# Patient Record
Sex: Male | Born: 1955 | Race: Black or African American | Hispanic: No | Marital: Single | Smoking: Current every day smoker
Health system: Southern US, Community
[De-identification: ages and names within clinical notes are randomized; demographics above are authoritative.]

## PROBLEM LIST (undated history)

## (undated) DIAGNOSIS — K7030 Alcoholic cirrhosis of liver without ascites: Secondary | ICD-10-CM

## (undated) DIAGNOSIS — I10 Essential (primary) hypertension: Secondary | ICD-10-CM

## (undated) DIAGNOSIS — F1020 Alcohol dependence, uncomplicated: Secondary | ICD-10-CM

## (undated) DIAGNOSIS — J449 Chronic obstructive pulmonary disease, unspecified: Secondary | ICD-10-CM

## (undated) DIAGNOSIS — M1990 Unspecified osteoarthritis, unspecified site: Secondary | ICD-10-CM

## (undated) DIAGNOSIS — G629 Polyneuropathy, unspecified: Secondary | ICD-10-CM

## (undated) DIAGNOSIS — G9340 Encephalopathy, unspecified: Secondary | ICD-10-CM

## (undated) HISTORY — PX: JOINT REPLACEMENT: SHX530

---

## 2013-09-11 DIAGNOSIS — F1090 Alcohol use, unspecified, uncomplicated: Secondary | ICD-10-CM

## 2013-09-11 HISTORY — DX: Alcohol use, unspecified, uncomplicated: F10.90

## 2013-09-19 DIAGNOSIS — Z96649 Presence of unspecified artificial hip joint: Secondary | ICD-10-CM | POA: Insufficient documentation

## 2013-09-19 HISTORY — DX: Presence of unspecified artificial hip joint: Z96.649

## 2013-09-20 DIAGNOSIS — M1612 Unilateral primary osteoarthritis, left hip: Secondary | ICD-10-CM | POA: Insufficient documentation

## 2013-09-20 HISTORY — DX: Unilateral primary osteoarthritis, left hip: M16.12

## 2016-01-06 DIAGNOSIS — G8929 Other chronic pain: Secondary | ICD-10-CM | POA: Insufficient documentation

## 2016-01-06 HISTORY — DX: Other chronic pain: G89.29

## 2016-05-19 DIAGNOSIS — R0683 Snoring: Secondary | ICD-10-CM | POA: Insufficient documentation

## 2016-05-19 DIAGNOSIS — J449 Chronic obstructive pulmonary disease, unspecified: Secondary | ICD-10-CM | POA: Insufficient documentation

## 2016-05-19 HISTORY — DX: Chronic obstructive pulmonary disease, unspecified: J44.9

## 2016-05-19 HISTORY — DX: Snoring: R06.83

## 2016-09-07 DIAGNOSIS — F1010 Alcohol abuse, uncomplicated: Secondary | ICD-10-CM | POA: Insufficient documentation

## 2016-09-07 DIAGNOSIS — E785 Hyperlipidemia, unspecified: Secondary | ICD-10-CM | POA: Insufficient documentation

## 2016-09-07 DIAGNOSIS — B182 Chronic viral hepatitis C: Secondary | ICD-10-CM | POA: Insufficient documentation

## 2016-09-07 DIAGNOSIS — E230 Hypopituitarism: Secondary | ICD-10-CM | POA: Insufficient documentation

## 2016-09-07 DIAGNOSIS — E291 Testicular hypofunction: Secondary | ICD-10-CM

## 2016-09-07 DIAGNOSIS — F1411 Cocaine abuse, in remission: Secondary | ICD-10-CM | POA: Insufficient documentation

## 2016-09-07 DIAGNOSIS — E871 Hypo-osmolality and hyponatremia: Secondary | ICD-10-CM

## 2016-09-07 DIAGNOSIS — D509 Iron deficiency anemia, unspecified: Secondary | ICD-10-CM | POA: Insufficient documentation

## 2016-09-07 HISTORY — DX: Testicular hypofunction: E29.1

## 2016-09-07 HISTORY — DX: Cocaine abuse, in remission: F14.11

## 2016-09-07 HISTORY — DX: Chronic viral hepatitis C: B18.2

## 2016-09-07 HISTORY — DX: Hypopituitarism: E23.0

## 2016-09-07 HISTORY — DX: Hyperlipidemia, unspecified: E78.5

## 2016-09-07 HISTORY — DX: Hypo-osmolality and hyponatremia: E87.1

## 2016-09-07 HISTORY — DX: Alcohol abuse, uncomplicated: F10.10

## 2016-09-07 HISTORY — DX: Iron deficiency anemia, unspecified: D50.9

## 2016-09-09 DIAGNOSIS — E876 Hypokalemia: Secondary | ICD-10-CM | POA: Insufficient documentation

## 2016-09-09 HISTORY — DX: Hypokalemia: E87.6

## 2017-03-28 DIAGNOSIS — E878 Other disorders of electrolyte and fluid balance, not elsewhere classified: Secondary | ICD-10-CM

## 2017-03-28 HISTORY — DX: Other disorders of electrolyte and fluid balance, not elsewhere classified: E87.8

## 2017-04-09 DIAGNOSIS — M818 Other osteoporosis without current pathological fracture: Secondary | ICD-10-CM

## 2017-04-09 HISTORY — DX: Other osteoporosis without current pathological fracture: M81.8

## 2018-01-25 DIAGNOSIS — R7989 Other specified abnormal findings of blood chemistry: Secondary | ICD-10-CM

## 2018-01-25 HISTORY — DX: Other specified abnormal findings of blood chemistry: R79.89

## 2019-03-21 DIAGNOSIS — F1410 Cocaine abuse, uncomplicated: Secondary | ICD-10-CM | POA: Insufficient documentation

## 2019-03-21 HISTORY — DX: Cocaine abuse, uncomplicated: F14.10

## 2019-05-31 DIAGNOSIS — F1290 Cannabis use, unspecified, uncomplicated: Secondary | ICD-10-CM | POA: Insufficient documentation

## 2019-05-31 DIAGNOSIS — E785 Hyperlipidemia, unspecified: Secondary | ICD-10-CM | POA: Insufficient documentation

## 2019-05-31 HISTORY — DX: Hyperlipidemia, unspecified: E78.5

## 2019-05-31 HISTORY — DX: Cannabis use, unspecified, uncomplicated: F12.90

## 2020-09-10 DIAGNOSIS — K7030 Alcoholic cirrhosis of liver without ascites: Secondary | ICD-10-CM | POA: Insufficient documentation

## 2020-09-10 DIAGNOSIS — R1310 Dysphagia, unspecified: Secondary | ICD-10-CM | POA: Insufficient documentation

## 2020-09-10 DIAGNOSIS — R410 Disorientation, unspecified: Secondary | ICD-10-CM | POA: Insufficient documentation

## 2020-09-10 DIAGNOSIS — E512 Wernicke's encephalopathy: Secondary | ICD-10-CM | POA: Insufficient documentation

## 2020-09-10 HISTORY — DX: Dysphagia, unspecified: R13.10

## 2020-09-10 HISTORY — DX: Wernicke's encephalopathy: E51.2

## 2020-09-10 HISTORY — DX: Alcoholic cirrhosis of liver without ascites: K70.30

## 2020-09-10 HISTORY — DX: Disorientation, unspecified: R41.0

## 2020-09-11 DIAGNOSIS — N390 Urinary tract infection, site not specified: Secondary | ICD-10-CM | POA: Insufficient documentation

## 2020-09-11 DIAGNOSIS — B9620 Unspecified Escherichia coli [E. coli] as the cause of diseases classified elsewhere: Secondary | ICD-10-CM

## 2020-09-11 HISTORY — DX: Urinary tract infection, site not specified: B96.20

## 2021-02-24 ENCOUNTER — Encounter: Payer: Self-pay | Admitting: Nurse Practitioner

## 2021-02-24 ENCOUNTER — Non-Acute Institutional Stay: Payer: Medicare Other | Admitting: Nurse Practitioner

## 2021-02-24 VITALS — BP 121/68 | HR 71 | Temp 97.1°F | Resp 18 | Wt 155.6 lb

## 2021-02-24 DIAGNOSIS — Z515 Encounter for palliative care: Secondary | ICD-10-CM

## 2021-02-24 DIAGNOSIS — R5381 Other malaise: Secondary | ICD-10-CM

## 2021-02-24 NOTE — Progress Notes (Signed)
[facility] Consult Note Telephone: [phone]  Fax: [phone]   Date of encounter: 02/24/21 PATIENT NAME: Waynoka Rm [place] [address]   [phone] (home)  DOB: 09/06/1955 MRN: [id number] PRIMARY CARE PROVIDER:   Dr Lucianne Lei [facility] RESPONSIBLE PARTY:  Brother Yiannis Tulloch:  [phone]  I met face to face with patient in facility. Palliative Care was asked to follow this patient by consultation request of  Dr Nona Dell to address advance care planning and complex medical decision making. This is the initial visit.  ASSESSMENT AND PLAN / RECOMMENDATIONS:  Symptom Management/Plan: ACP: full code with wishes full scope interventions; pending discussions with brother 2. Debility secondary to COPD, dysphagia, continue to encourage mobility, strengthening, self independence 3. Tobacco cessation declined 4. Goals of Care: Goals include to maximize quality of life and symptom management. Our advance care planning conversation included a discussion about:    The value and importance of advance care planning  Exploration of personal, cultural or spiritual beliefs that might influence medical decisions  Exploration of goals of care in the event of a sudden injury or illness  Identification and preparation of a healthcare agent  Review and updating or creation of an advance directive document. 5. Palliative care encounter; Palliative care encounter; Palliative medicine team will continue to support patient, patient's family, and medical team. Visit consisted of counseling and education dealing with the complex and emotionally intense issues of symptom management and palliative care in the setting of serious and potentially life-threatening illness  Follow up Palliative Care Visit: Palliative care will continue to follow for complex medical decision making, advance care  planning, and clarification of goals. Return 4 weeks or prn.  I spent 65 minutes providing this consultation. More than 50% of the time in this consultation was spent in counseling and care coordination. PPS: 50%  Chief Complaint: Initial palliative consult for complex medical decision making  HISTORY OF PRESENT ILLNESS:  Dal Blew is a 65 y.o. year old male  with multiple medical problems to include COPD, dysphasia, hepatic stenosis, hypertension, hyperlipidemia, substance abuse including alcohol, cocaine abuse, Protein calorie malnutrition. 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 Mr. Hannen was discharged to [facility], [place] facility where he currently resides. He does transfer to the wheelchair where he is mobile. Mr Monforte does go out to smoke on the smoking patio. Mr Stankowski does require some assistance with adls bathing, dressing. Mr. Bun does feed himself with fair appetite. Mr.Karpel is on a heart healthy diet regular texture regular liquid consistency with current weight 155.6 pounds, BMI 20. Mr. Wallman is a full code. Staff endorses no new changes or concerns. At present Mr Mckillop is lying in bed asleep. Mr Winker did awake to verbal cues. We talked about purpose of palliative care visit. Mr Crofford and agreement. We talked about how he was feeling. Mr Rutigliano endorses doing better he is ready to go home. We talked about the last time he was independent at home. Mr Atchley endorses that his mother passed away in 09/10/2022 and then he it began drinking with substance abuse and ended up in the hospital. We talked about past medical history. We talked about smoking cessation which he declines. About symptoms of pain and shortness of breath which he denies. We talked about his appetite including foods that he likes. We talked  about his current functional abilities. We talked about life review  period we talked about not married without children. We talked about prior hospitalization he was living in an apartment. We talked about currently residing at skilled facility. We talked about realistic expectations. We talked about coping strategies. We talked about role palliative care and plan of care. We talked about follow up visit in four weeks if needed or sooner should he decline. Medical goals reviewed. I have attempted to contact his brother Karman Veney for update on palliative care visit, further discussion of code status and goals of care. I updated staff no new changes at present time.  11/09/2020 weight 144 lbs 12/02/2020 weight 149 lbs 01/13/2021 weight 155.6 lbs BMI 20  02/04/2021 WBC 4.8, hemoglobin 8.0, hematocrit 24.4, platelets 191, sodium 139, potassium 4.2, chloride 105, CO2 21, calcium 9.2, BUN 10.9, creatinine 0.5 8, glucose 80, total protein 6.9, albumin 3.8, ALt 20, AST33, hemoglobin A1C 5.3  History obtained from review of EMR, discussion with facility staff and  Mr. Gravlin.  I reviewed available labs, medications, imaging, studies and related documents from the EMR.  Records reviewed and summarized above.   ROS Full 14 system review of systems performed and negative with exception of: as per HPI.   Physical Exam: Constitutional: NAD General: deconditioned pleasant male EYES: lids intact ENMT: oral mucous membranes moist CV: S1S2, RRR Pulmonary: LCTA, no increased work of breathing, no cough, room air Abdomen: normo-active BS + 4 quadrants, soft and non tender GU: deferred MSK: w/c dependent Skin: warm and dry, no rashes or wounds on visible skin Neuro:  +generalized weakness,  no cognitive impairment Psych: non-anxious affect, A and O x 3  SOCIAL HX:  Social History   Tobacco Use   Smoking status: Not on file   Smokeless tobacco: Not on file  Substance Use Topics   Alcohol use: Not on file   FAMILY HX:   ALLERGIES: ibuprofen, lisinopril,  losartin Questions and concerns were addressed. The facility/family was encouraged to call with questions and/or concerns. My business card was provided. Provided general support and encouragement, no other unmet needs identified   Thank you for the opportunity to participate in the care of Mr. Cu.  The palliative care team will continue to follow. Please call our office at [phone] if we can be of additional assistance.   This chart was dictated using voice recognition software.  Despite best efforts to proofread,  errors can occur which can change the documentation meaning.   Sye Schroepfer Z Nel Stoneking, NP ,

## 2021-02-25 ENCOUNTER — Other Ambulatory Visit: Payer: Self-pay

## 2021-03-16 ENCOUNTER — Encounter: Payer: Self-pay | Admitting: Nurse Practitioner

## 2021-03-16 ENCOUNTER — Non-Acute Institutional Stay: Payer: Medicare Other | Admitting: Nurse Practitioner

## 2021-03-16 VITALS — BP 132/64 | HR 74 | Temp 97.6°F | Resp 18 | Wt 115.6 lb

## 2021-03-16 DIAGNOSIS — R5381 Other malaise: Secondary | ICD-10-CM

## 2021-03-16 DIAGNOSIS — Z515 Encounter for palliative care: Secondary | ICD-10-CM

## 2021-03-16 DIAGNOSIS — Z716 Tobacco abuse counseling: Secondary | ICD-10-CM

## 2021-03-16 NOTE — Progress Notes (Signed)
[facility] Consult Note Telephone: [phone]  Fax: [phone]    Date of encounter: 03/16/21 PATIENT NAME: Ryan Dunlap [id number]   [phone] (home)  DOB: 1956-01-30 MRN: [id number] PRIMARY CARE PROVIDER:    Dr Lucianne Lei [facility]  RESPONSIBLE PARTY:    Contact Information   None on File    I met face to face with patient in facility. Palliative Care was asked to follow this patient by consultation request of  Dr Nona Dell to address advance care planning and complex medical decision making. This is a follow up visit. ASSESSMENT AND PLAN / RECOMMENDATIONS:   Symptom Management/Plan: ACP: full code with wishes full scope interventions; pending discussions with brother 2. Debility secondary to COPD, dysphagia, continue to encourage mobility, strengthening, self independence, continues to improve 3. Tobacco cessation declined 4. Palliative care encounter; Palliative care encounter; Palliative medicine team will continue to support patient, patient's family, and medical team. Visit consisted of counseling and education dealing with the complex and emotionally intense issues of symptom management and palliative care in the setting of serious and potentially life-threatening illness  11/09/2020 weight 144 lbs 12/02/2020 weight 149 lbs 01/13/2021 weight 155.6 lbs  No further weight  Follow up Palliative Care Visit: Palliative care will continue to follow for complex medical decision making, advance care planning, and clarification of goals. Return 8 weeks or prn.  I spent 40 minutes providing this consultation. More than 50% of the time in this consultation was spent in counseling and care coordination.  PPS: 50%  Chief Complaint: Follow up palliative consult for complex medical decision making  HISTORY OF PRESENT ILLNESS:  Ryan Dunlap is a 65 y.o. year old male   with multiple medical problems to include COPD, dysphasia, hepatic stenosis, hypertension, hyperlipidemia, substance abuse including alcohol, cocaine abuse, Protein calorie malnutrition. 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 Ryan Dunlap was discharged to [facility] [place] facility where he currently resides. He does transfer to the wheelchair where he is mobile. Ryan Dunlap does require some assistance with adls bathing, dressing. Ryan Dunlap does feed himself with fair appetite. Ryan Dunlap is on a heart healthy diet regular texture regular liquid consistency with current weight, No new changes per staff. No recent falls, wounds, infections, hospitalizations per staff. Ryan Dunlap is a full code. Ryan Dunlap does go out to smoke on the smoking patio. At present, Ryan Dunlap is lying in bed, getting ready to take a nap. I visited and observed Ryan Dunlap. We talked about purpose of PC visit. Ryan Dunlap in agreement. We talked about how his day has been. Ryan Dunlap endorses is doing good. Ryan Dunlap endorses he continues to go outside to smoke, declines smoking cessation, did attempt to educate. We talked about symptoms of pain, shortness of breath which Ryan. Hyson declines. We talked about his appetite which continues to improve. We talked about residing at SNF, barriers with coping strategies. At this time Ryan Dunlap continues to overall adjusting to LTC. We talked about role pc in poc. Medical goals reviewed. Discussed will f/u in 8 weeks to continue to monitor weights, adjustment to LTC, symptoms, progression. Ryan Dunlap in agreement. Therapeutic listening, emotional support provided. I updated staff, no new recommendations today.   History obtained from review of EMR, discussion with facility staff and Ryan. Hilgers.  I reviewed available labs,  medications, imaging, studies and related documents from the EMR.  Records  reviewed and summarized above.   ROS Full 14 system review of systems performed and negative with exception of: as per HPI.   Physical Exam: Constitutional: NAD General: thin, pleasant male EYES: alids intact ENMT:  oral mucous membranes moist CV: S1S2, RRR Pulmonary: LCTA, no increased work of breathing, no cough, room air Abdomen:  normo-active BS + 4 quadrants, soft and non tender, MSK: w/c dependent Skin: warm and dry Neuro:  + generalized weakness,  +mild cognitive impairment Psych: non-anxious affect, A and O x 3  Questions and concerns were addressed. The patient/facility staff was encouraged to call with questions and/or concerns.  Provided general support and encouragement, no other unmet needs identified   Thank you for the opportunity to participate in the care of Ryan Dunlap.  The palliative care team will continue to follow. Please call our office at [phone] if we can be of additional assistance.   This chart was dictated using voice recognition software.  Despite best efforts to proofread,  errors can occur which can change the documentation meaning.   Jais Demir Ihor Gully, NP

## 2021-03-17 ENCOUNTER — Other Ambulatory Visit: Payer: Self-pay

## 2021-03-31 ENCOUNTER — Encounter: Payer: Self-pay | Admitting: Emergency Medicine

## 2021-03-31 ENCOUNTER — Inpatient Hospital Stay
Admission: EM | Admit: 2021-03-31 | Discharge: 2021-04-02 | DRG: 864 | Disposition: A | Discharge status: Skilled Nursing Facility | Payer: Medicare Other | Attending: Internal Medicine | Admitting: Internal Medicine

## 2021-03-31 ENCOUNTER — Emergency Department: Payer: Medicare Other

## 2021-03-31 DIAGNOSIS — R7989 Other specified abnormal findings of blood chemistry: Secondary | ICD-10-CM | POA: Diagnosis not present

## 2021-03-31 DIAGNOSIS — K7030 Alcoholic cirrhosis of liver without ascites: Secondary | ICD-10-CM | POA: Diagnosis present

## 2021-03-31 DIAGNOSIS — I10 Essential (primary) hypertension: Secondary | ICD-10-CM | POA: Diagnosis present

## 2021-03-31 DIAGNOSIS — R509 Fever, unspecified: Principal | ICD-10-CM | POA: Diagnosis present

## 2021-03-31 DIAGNOSIS — Z888 Allergy status to other drugs, medicaments and biological substances status: Secondary | ICD-10-CM | POA: Diagnosis not present

## 2021-03-31 DIAGNOSIS — Z96642 Presence of left artificial hip joint: Secondary | ICD-10-CM | POA: Diagnosis present

## 2021-03-31 DIAGNOSIS — Z833 Family history of diabetes mellitus: Secondary | ICD-10-CM

## 2021-03-31 DIAGNOSIS — Z886 Allergy status to analgesic agent status: Secondary | ICD-10-CM

## 2021-03-31 DIAGNOSIS — Z20822 Contact with and (suspected) exposure to covid-19: Secondary | ICD-10-CM | POA: Diagnosis present

## 2021-03-31 DIAGNOSIS — D509 Iron deficiency anemia, unspecified: Secondary | ICD-10-CM | POA: Diagnosis present

## 2021-03-31 DIAGNOSIS — D72829 Elevated white blood cell count, unspecified: Secondary | ICD-10-CM | POA: Diagnosis not present

## 2021-03-31 DIAGNOSIS — R6510 Systemic inflammatory response syndrome (SIRS) of non-infectious origin without acute organ dysfunction: Secondary | ICD-10-CM | POA: Diagnosis present

## 2021-03-31 DIAGNOSIS — K760 Fatty (change of) liver, not elsewhere classified: Secondary | ICD-10-CM | POA: Diagnosis present

## 2021-03-31 DIAGNOSIS — A419 Sepsis, unspecified organism: Secondary | ICD-10-CM | POA: Diagnosis not present

## 2021-03-31 DIAGNOSIS — Z96652 Presence of left artificial knee joint: Secondary | ICD-10-CM | POA: Diagnosis present

## 2021-03-31 DIAGNOSIS — E872 Acidosis: Secondary | ICD-10-CM | POA: Diagnosis present

## 2021-03-31 DIAGNOSIS — J449 Chronic obstructive pulmonary disease, unspecified: Secondary | ICD-10-CM | POA: Diagnosis present

## 2021-03-31 HISTORY — DX: Essential (primary) hypertension: I10

## 2021-03-31 HISTORY — DX: Encephalopathy, unspecified: G93.40

## 2021-03-31 HISTORY — DX: Chronic obstructive pulmonary disease, unspecified: J44.9

## 2021-03-31 HISTORY — DX: Alcoholic cirrhosis of liver without ascites: K70.30

## 2021-03-31 LAB — CBC WITH DIFFERENTIAL/PLATELET
Abs Immature Granulocytes: 0.04 10*3/uL (ref 0.00–0.07)
Basophils Absolute: 0 10*3/uL (ref 0.0–0.1)
Basophils Relative: 0 %
Eosinophils Absolute: 0.1 10*3/uL (ref 0.0–0.5)
Eosinophils Relative: 1 %
HCT: 29.4 % — ABNORMAL LOW (ref 39.0–52.0)
Hemoglobin: 9.7 g/dL — ABNORMAL LOW (ref 13.0–17.0)
Immature Granulocytes: 0 %
Lymphocytes Relative: 17 %
Lymphs Abs: 2 10*3/uL (ref 0.7–4.0)
MCH: 25.3 pg — ABNORMAL LOW (ref 26.0–34.0)
MCHC: 33 g/dL (ref 30.0–36.0)
MCV: 76.6 fL — ABNORMAL LOW (ref 80.0–100.0)
Monocytes Absolute: 1 10*3/uL (ref 0.1–1.0)
Monocytes Relative: 9 %
Neutro Abs: 8.1 10*3/uL — ABNORMAL HIGH (ref 1.7–7.7)
Neutrophils Relative %: 73 %
Platelets: 209 10*3/uL (ref 150–400)
RBC: 3.84 MIL/uL — ABNORMAL LOW (ref 4.22–5.81)
RDW: 19.1 % — ABNORMAL HIGH (ref 11.5–15.5)
WBC: 11.3 10*3/uL — ABNORMAL HIGH (ref 4.0–10.5)
nRBC: 0 % (ref 0.0–0.2)

## 2021-03-31 LAB — PROTIME-INR
INR: 1.1 (ref 0.8–1.2)
Prothrombin Time: 14.5 seconds (ref 11.4–15.2)

## 2021-03-31 LAB — COMPREHENSIVE METABOLIC PANEL
ALT: 26 U/L (ref 0–44)
AST: 35 U/L (ref 15–41)
Albumin: 3.9 g/dL (ref 3.5–5.0)
Alkaline Phosphatase: 136 U/L — ABNORMAL HIGH (ref 38–126)
Anion gap: 11 (ref 5–15)
BUN: 21 mg/dL (ref 8–23)
CO2: 17 mmol/L — ABNORMAL LOW (ref 22–32)
Calcium: 9.3 mg/dL (ref 8.9–10.3)
Chloride: 107 mmol/L (ref 98–111)
Creatinine, Ser: 0.75 mg/dL (ref 0.61–1.24)
GFR, Estimated: >60 mL/min (ref >60)
Glucose, Bld: 100 mg/dL — ABNORMAL HIGH (ref 70–99)
Potassium: 3.6 mmol/L (ref 3.5–5.1)
Sodium: 135 mmol/L (ref 135–145)
Total Bilirubin: 0.7 mg/dL (ref 0.3–1.2)
Total Protein: 8.2 g/dL — ABNORMAL HIGH (ref 6.5–8.1)

## 2021-03-31 LAB — URINALYSIS, COMPLETE (UACMP) WITH MICROSCOPIC
Bacteria, UA: NONE SEEN
Bilirubin Urine: NEGATIVE
Glucose, UA: NEGATIVE mg/dL
Hgb urine dipstick: NEGATIVE
Ketones, ur: NEGATIVE mg/dL
Leukocytes,Ua: NEGATIVE
Nitrite: NEGATIVE
Protein, ur: NEGATIVE mg/dL
Specific Gravity, Urine: 1.012 (ref 1.005–1.030)
pH: 6 (ref 5.0–8.0)

## 2021-03-31 LAB — RESP PANEL BY RT-PCR (FLU A&B, COVID) ARPGX2
Influenza A by PCR: NEGATIVE
Influenza B by PCR: NEGATIVE
SARS Coronavirus 2 by RT PCR: NEGATIVE

## 2021-03-31 LAB — TROPONIN I (HIGH SENSITIVITY)
Troponin I (High Sensitivity): 7 ng/L (ref <18)
Troponin I (High Sensitivity): 11 ng/L (ref <18)

## 2021-03-31 LAB — HIV ANTIBODY (ROUTINE TESTING W REFLEX): HIV Screen 4th Generation wRfx: NONREACTIVE

## 2021-03-31 LAB — LACTIC ACID, PLASMA
Lactic Acid, Venous: 2.1 mmol/L (ref 0.5–1.9)
Lactic Acid, Venous: 2.2 mmol/L (ref 0.5–1.9)
Lactic Acid, Venous: 2 mmol/L (ref 0.5–1.9)

## 2021-03-31 MED ORDER — LACTATED RINGERS IV BOLUS (SEPSIS)
1000.0000 mL | Freq: Once | INTRAVENOUS | Status: AC
  Administered 2021-03-31: 1000 mL via INTRAVENOUS

## 2021-03-31 MED ORDER — DICLOFENAC SODIUM 1 % EX GEL
2.0000 g | Freq: Four times a day (QID) | CUTANEOUS | Status: DC
  Administered 2021-03-31 – 2021-04-02 (×7): 2 g via TOPICAL
  Filled 2021-03-31: qty 100

## 2021-03-31 MED ORDER — VANCOMYCIN HCL IN DEXTROSE 1-5 GM/200ML-% IV SOLN
1000.0000 mg | Freq: Once | INTRAVENOUS | Status: AC
  Administered 2021-03-31: 1000 mg via INTRAVENOUS
  Filled 2021-03-31: qty 200

## 2021-03-31 MED ORDER — ENOXAPARIN SODIUM 40 MG/0.4ML IJ SOSY
40.0000 mg | PREFILLED_SYRINGE | INTRAMUSCULAR | Status: DC
  Administered 2021-03-31 – 2021-04-01 (×2): 40 mg via SUBCUTANEOUS
  Filled 2021-03-31 (×2): qty 0.4

## 2021-03-31 MED ORDER — THIAMINE HCL 100 MG PO TABS
100.0000 mg | ORAL_TABLET | Freq: Every day | ORAL | Status: DC
  Administered 2021-03-31 – 2021-04-02 (×3): 100 mg via ORAL
  Filled 2021-03-31 (×3): qty 1

## 2021-03-31 MED ORDER — MAGNESIUM HYDROXIDE 400 MG/5ML PO SUSP
30.0000 mL | Freq: Every day | ORAL | Status: DC | PRN
  Filled 2021-03-31: qty 30

## 2021-03-31 MED ORDER — ENOXAPARIN SODIUM 40 MG/0.4ML IJ SOSY: 40.0000 mg | PREFILLED_SYRINGE | INTRAMUSCULAR | Status: DC

## 2021-03-31 MED ORDER — SODIUM CHLORIDE 0.9 % IV SOLN: INTRAVENOUS | Status: DC

## 2021-03-31 MED ORDER — ADULT MULTIVITAMIN W/MINERALS CH
1.0000 | ORAL_TABLET | Freq: Every day | ORAL | Status: DC
  Administered 2021-03-31 – 2021-04-02 (×3): 1 via ORAL
  Filled 2021-03-31 (×3): qty 1

## 2021-03-31 MED ORDER — TRAZODONE HCL 50 MG PO TABS
25.0000 mg | ORAL_TABLET | Freq: Every evening | ORAL | Status: DC | PRN
  Administered 2021-03-31 – 2021-04-01 (×2): 25 mg via ORAL
  Filled 2021-03-31 (×2): qty 1

## 2021-03-31 MED ORDER — SODIUM CHLORIDE 0.9 % IV SOLN: 2.0000 g | Freq: Once | INTRAVENOUS | Status: DC

## 2021-03-31 MED ORDER — METRONIDAZOLE 500 MG/100ML IV SOLN
500.0000 mg | Freq: Two times a day (BID) | INTRAVENOUS | Status: DC
  Administered 2021-03-31 – 2021-04-01 (×4): 500 mg via INTRAVENOUS
  Filled 2021-03-31 (×5): qty 100

## 2021-03-31 MED ORDER — ACETAMINOPHEN 650 MG RE SUPP: 650.0000 mg | Freq: Four times a day (QID) | RECTAL | Status: DC | PRN

## 2021-03-31 MED ORDER — PANTOPRAZOLE SODIUM 40 MG PO TBEC
40.0000 mg | DELAYED_RELEASE_TABLET | Freq: Every day | ORAL | Status: DC
  Administered 2021-03-31 – 2021-04-02 (×3): 40 mg via ORAL
  Filled 2021-03-31 (×3): qty 1

## 2021-03-31 MED ORDER — AMLODIPINE BESYLATE 10 MG PO TABS
10.0000 mg | ORAL_TABLET | Freq: Every day | ORAL | Status: DC
  Administered 2021-03-31 – 2021-04-02 (×3): 10 mg via ORAL
  Filled 2021-03-31 (×2): qty 1
  Filled 2021-03-31: qty 2

## 2021-03-31 MED ORDER — ONDANSETRON HCL 4 MG PO TABS: 4.0000 mg | ORAL_TABLET | Freq: Four times a day (QID) | ORAL | Status: DC | PRN

## 2021-03-31 MED ORDER — FOLIC ACID 1 MG PO TABS
1.0000 mg | ORAL_TABLET | Freq: Every day | ORAL | Status: DC
  Administered 2021-03-31 – 2021-04-02 (×3): 1 mg via ORAL
  Filled 2021-03-31 (×3): qty 1

## 2021-03-31 MED ORDER — VANCOMYCIN HCL IN DEXTROSE 1-5 GM/200ML-% IV SOLN: 1000.0000 mg | Freq: Once | INTRAVENOUS | Status: DC

## 2021-03-31 MED ORDER — SODIUM CHLORIDE 0.9 % IV SOLN
2.0000 g | Freq: Three times a day (TID) | INTRAVENOUS | Status: DC
  Administered 2021-03-31 – 2021-04-02 (×6): 2 g via INTRAVENOUS
  Filled 2021-03-31 (×11): qty 2

## 2021-03-31 MED ORDER — TIOTROPIUM BROMIDE MONOHYDRATE 18 MCG IN CAPS
1.0000 | ORAL_CAPSULE | Freq: Every day | RESPIRATORY_TRACT | Status: DC
  Administered 2021-03-31 – 2021-04-01 (×2): 18 ug via RESPIRATORY_TRACT
  Filled 2021-03-31: qty 5

## 2021-03-31 MED ORDER — VANCOMYCIN HCL 1500 MG/300ML IV SOLN
1500.0000 mg | Freq: Two times a day (BID) | INTRAVENOUS | Status: DC
  Administered 2021-03-31 – 2021-04-01 (×3): 1500 mg via INTRAVENOUS
  Filled 2021-03-31 (×5): qty 300

## 2021-03-31 MED ORDER — BISACODYL 10 MG/30ML RE ENEM
10.0000 mg | ENEMA | Freq: Once | RECTAL | Status: DC
  Filled 2021-03-31: qty 30

## 2021-03-31 MED ORDER — ACETAMINOPHEN 325 MG PO TABS: 650.0000 mg | ORAL_TABLET | Freq: Four times a day (QID) | ORAL | Status: DC | PRN

## 2021-03-31 MED ORDER — VITAMIN D 25 MCG (1000 UNIT) PO TABS
500.0000 [IU] | ORAL_TABLET | Freq: Every day | ORAL | Status: DC
  Administered 2021-03-31 – 2021-04-02 (×3): 500 [IU] via ORAL
  Filled 2021-03-31 (×3): qty 1

## 2021-03-31 MED ORDER — TRAMADOL HCL 50 MG PO TABS: 25.0000 mg | ORAL_TABLET | Freq: Four times a day (QID) | ORAL | Status: DC | PRN

## 2021-03-31 MED ORDER — SODIUM CHLORIDE 0.9 % IV SOLN
2.0000 g | Freq: Once | INTRAVENOUS | Status: AC
  Administered 2021-03-31: 2 g via INTRAVENOUS
  Filled 2021-03-31: qty 2

## 2021-03-31 MED ORDER — ONDANSETRON HCL 4 MG/2ML IJ SOLN: 4.0000 mg | Freq: Four times a day (QID) | INTRAMUSCULAR | Status: DC | PRN

## 2021-03-31 NOTE — ED Notes (Signed)
Port CXR performed 

## 2021-03-31 NOTE — ED Notes (Signed)
Care transferred, report given to Raquel, RN

## 2021-03-31 NOTE — Progress Notes (Signed)
PHARMACY -  BRIEF ANTIBIOTIC NOTE   Pharmacy has received consult(s) for Vanc, Cefepime from an ED provider.  The patient's profile has been reviewed for ht/wt/allergies/indication/available labs.    One time order(s) placed for Vancomycin 1 gm IV X 1 and Cefepime 2 gm IV X 1.   Further antibiotics/pharmacy consults should be ordered by admitting physician if indicated.                       Thank you, Traveon Louro D 03/31/2021  5:18 AM

## 2021-03-31 NOTE — ED Notes (Addendum)
Critical lactic--2.1, MD notified and aware

## 2021-03-31 NOTE — ED Notes (Signed)
Michelle RN aware of assigned bed 

## 2021-03-31 NOTE — Progress Notes (Signed)
Pharmacy Antibiotic Note   Ryan Dunlap is a 65 y.o. male admitted on 03/31/2021 with sepsis.  Pharmacy has been consulted for Cefepime, Vancomycin  dosing.  Plan: Cefepime 2 gm IV X 1 given in ED on 8/30 @ 430. Cefepime 2 gm IV Q8H ordered to start on 8/30 @ 1200.  Vancomycin 1 gm IV X 1 given in ED on 8/30 @ 0252. Additional Vancomycin 1 gm IV X 1 ordered to make total starting dose of 2 gm. Vancomycin 1500 mg IV Q12H ordered to continue on 8/30 @ 1500.   AUC = 500.3  Vanc trough = 12.8   Height: 6\' 3"  (190.5 cm) Weight: 87.8 kg (193 lb 9 oz) IBW/kg (Calculated) : 84.5  Temp (24hrs), Avg:100.6 F (38.1 C), Min:99.5 F (37.5 C), Max:102.7 F (39.3 C)  Recent Labs  Lab 03/31/21 0154 03/31/21 0400  WBC 11.3*  --   CREATININE 0.75  --   LATICACIDVEN 2.1* 2.2*    Estimated Creatinine Clearance: 110 mL/min (by C-G formula based on SCr of 0.75 mg/dL).    Allergies  Allergen Reactions   Ibuprofen    Lisinopril    Losartan     Antimicrobials this admission:   >>    >>   Dose adjustments this admission:   Microbiology results:  BCx:   UCx:    Sputum:    MRSA PCR:   Thank you for allowing pharmacy to be a part of this patient's care.  Kayliah Tindol D 03/31/2021 5:58 AM
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
## 2021-03-31 NOTE — ED Notes (Signed)
Pt up to bsc with assist, small formed BM.

## 2021-03-31 NOTE — ED Triage Notes (Addendum)
Pt arrived via [facility] from [facility] care where staff reported high fever oxygen room air sat at 92% and weakness x1 day. Pt received 1g Tylenol by facility prior to EMS arrival due to reported fever of 101.71F. Pt in ED with 102.57F oral temp and c/o bilateral foot pain. Pt is blind and has hx/o cirrhosis, COPD and hypertension. Pt denies urinary symptoms and SOB. Pt at 100% room air oxygen during triage in room.

## 2021-03-31 NOTE — Sepsis Progress Note (Signed)
Notified bedside nurse of need to order lactic acid as per protocol, previous lactic acid still remains above 2.

## 2021-03-31 NOTE — Progress Notes (Signed)
  DAY ZERO NOTE    Ryan Dunlap  NAT:[id number] DOB: [birth year] DOA: 03/31/2021 PCP: Pcp, No  Day 0 note, for complete history please see H&P earlier this morning  Brief Narrative:  Ryan Dunlap is a 65 y.o. male with medical history significant for alcoholic liver cirrhosis, hypertension and COPD, who presented to the emergency room with acute onset of fever 101.6 at facility without overt source, patient does meet SIRS criteria.  Given concern for acute infection admitted overnight.  Assessment & Plan:  Systemic inflammatory response syndrome as manifested by fever, tachycardia and tachypnea without clear source, does not meet sepsis criteria.   Concurrent lactic acidosis Unclear infectious source but presumed POA  -Continue close monitoring, broad-spectrum antibiotics with cefepime vancomycin and Flagyl given unclear source -Follow clinically if no clear source in the next 24 to 48 hours would consider discontinuation of antibiotics follow fever curve and symptoms - Cultures pending   COPD without acute hypoxia or flare. -Continue supportive care, nebs   DVT prophylaxis: Lovenox. Code Status: full code. Family Communication: None present, patient to update family  Status is: Inpatient  Dispo: The patient is from: Facility              Anticipated d/c is to: Name              Anticipated d/c date is: 48 to 72 hours              Patient currently not medically stable for discharge  Consultants:  None  Procedures:  None  Antimicrobials:  Cefepime, vancomycin, Flagyl 03/30/2021 ongoing  Subjective: No acute issues or events overnight  Objective: Vitals:   03/31/21 0530 03/31/21 0534 03/31/21 0547 03/31/21 0700  BP: 119/69   123/69  Pulse: 98   85  Resp: 19   15  Temp:   99.5 F (37.5 C)   TempSrc:   Oral   SpO2: 99%   99%  Weight:  87.8 kg    Height:  6\' 3"  (1.905 m)      Intake/Output Summary (Last 24 hours) at 03/31/2021 0812 Last data filed at 03/31/2021  0436 Gross per 24 hour  Intake 1711.95 ml  Output --  Net 1711.95 ml   Filed Weights   03/31/21 0534  Weight: 87.8 kg    Examination:  General:  Pleasantly resting in bed, No acute distress. HEENT:  Normocephalic atraumatic.  Sclerae nonicteric, noninjected.  Extraocular movements intact bilaterally. Neck:  Without mass or deformity.  Trachea is midline. Lungs:  Clear to auscultate bilaterally without rhonchi, wheeze, or rales. Heart:  Regular rate and rhythm.  Without murmurs, rubs, or gallops. Abdomen:  Soft, nontender, nondistended.  Without guarding or rebound. Extremities: Without cyanosis, clubbing, edema, or obvious deformity. Vascular:  Dorsalis pedis and posterior tibial pulses palpable bilaterally. Skin:  Warm and dry, no erythema, no ulcerations.   Data Reviewed: I have personally reviewed following labs and imaging studies   04/02/21, DO [facility]  If 7PM-7AM, please contact night-coverage [email or web address]  03/31/2021, 8:12 AM

## 2021-03-31 NOTE — ED Notes (Signed)
Unable to obtain blood cx at this time. So that antibiotic therapy isn't delayed, will attempt to obtain at a later time.

## 2021-03-31 NOTE — ED Provider Notes (Signed)
[facility] Emergency Department Provider Note ____________________________________________   Event Date/Time   First MD Initiated Contact with Patient 03/31/21 0124     (approximate)  I have reviewed the triage vital signs and the nursing notes.   HISTORY  Chief Complaint Fever    HPI Ryan Dunlap is a 65 y.o. male with PMH of hypertension, COPD, hepatic steatosis, and blindness who presents due to fever noted today at his nursing facility, measured to 101.6, associated with generalized weakness.  They also found his O2 saturation to be 92% on room air.  Per EMS, he was given Tylenol prior to arrival.  The patient denies any acute complaints.  He has no shortness of breath, cough, vomiting, diarrhea, or urinary symptoms.  Past Medical History:  Diagnosis Date   Cirrhosis, alcoholic (HCC)    COPD (chronic obstructive pulmonary disease) (HCC)    Encephalopathy    Hypertension     Patient Active Problem List   Diagnosis Date Noted   SIRS (systemic inflammatory response syndrome) (HCC) 03/31/2021    History reviewed. No pertinent surgical history.  Prior to Admission medications   Medication Sig Start Date End Date Taking? Authorizing Provider  diclofenac Sodium (VOLTAREN) 1 % GEL Apply 2 g topically 4 (four) times daily.   Yes [provider]    Allergies Ibuprofen, Lisinopril, and Losartan  History reviewed. No pertinent family history.  Social History Social History   Tobacco Use   Smoking status: Unknown    Review of Systems  Constitutional: Positive for fever. Eyes: No redness ENT: No sore throat. Cardiovascular: Denies chest pain. Respiratory: Denies shortness of breath. Gastrointestinal: No vomiting or diarrhea.  Genitourinary: Negative for dysuria.  Musculoskeletal: Negative for back pain. Skin: Negative for rash. Neurological: Negative for headache.   ____________________________________________   PHYSICAL  EXAM:  VITAL SIGNS: ED Triage Vitals [03/31/21 0145]  Enc Vitals Group     BP (!) 147/91     Pulse Rate (!) 118     Resp 17     Temp (!) 102.7 F (39.3 C)     Temp Source Oral     SpO2 100 %     Weight      Height      Head Circumference      Peak Flow      Pain Score      Pain Loc      Pain Edu?      Excl. in GC?     Constitutional: Alert and oriented.  Relatively well appearing, in no acute distress. Eyes: Conjunctivae are normal.  EOMI. Head: Atraumatic. Nose: No congestion/rhinnorhea. Mouth/Throat: Mucous membranes are somewhat dry. Neck: Normal range of motion.  Cardiovascular: Tachycardic, regular rhythm. Grossly normal heart sounds.  Good peripheral circulation. Respiratory: Normal respiratory effort.  No retractions. Lungs CTAB. Gastrointestinal: Soft and nontender. No distention.  Genitourinary: No flank tenderness. Musculoskeletal: No lower extremity edema.  Extremities warm and well perfused.  Neurologic:  Normal speech and language.  Motor intact in all extremities. Skin:  Skin is warm and dry. No rash noted. Psychiatric: Calm and cooperative.  ____________________________________________   LABS (all labs ordered are listed, but only abnormal results are displayed)  Labs Reviewed  COMPREHENSIVE METABOLIC PANEL - Abnormal; Notable for the following components:      Result Value   CO2 17 (*)    Glucose, Bld 100 (*)    Total Protein 8.2 (*)    Alkaline Phosphatase 136 (*)  All other components within normal limits  LACTIC ACID, PLASMA - Abnormal; Notable for the following components:   Lactic Acid, Venous 2.1 (*)    All other components within normal limits  CBC WITH DIFFERENTIAL/PLATELET - Abnormal; Notable for the following components:   WBC 11.3 (*)    RBC 3.84 (*)    Hemoglobin 9.7 (*)    HCT 29.4 (*)    MCV 76.6 (*)    MCH 25.3 (*)    RDW 19.1 (*)    Neutro Abs 8.1 (*)    All other components within normal limits  URINALYSIS, COMPLETE  (UACMP) WITH MICROSCOPIC - Abnormal; Notable for the following components:   Color, Urine YELLOW (*)    APPearance CLEAR (*)    All other components within normal limits  RESP PANEL BY RT-PCR (FLU A&B, COVID) [id number]  CULTURE, BLOOD (ROUTINE X 2)  CULTURE, BLOOD (ROUTINE X 2)  PROTIME-INR  LACTIC ACID, PLASMA  TROPONIN I (HIGH SENSITIVITY)  TROPONIN I (HIGH SENSITIVITY)   ____________________________________________  EKG  ED ECG REPORT I, Dionne Bucy, the attending physician, personally viewed and interpreted this ECG.  Date: 03/31/2021 EKG Time: 0137 Rate: 125 Rhythm: Sinus tachycardia QRS Axis: normal Intervals: normal ST/T Wave abnormalities: normal Narrative Interpretation: no evidence of acute ischemia  ____________________________________________  RADIOLOGY  Chest x-ray interpreted by me shows no focal consolidation or edema  ____________________________________________   PROCEDURES  Procedure(s) performed: No  Procedures  Critical Care performed: No ____________________________________________   INITIAL IMPRESSION / ASSESSMENT AND PLAN / ED COURSE  Pertinent labs & imaging results that were available during my care of the patient were reviewed by me and considered in my medical decision making (see chart for details).   65 year old male with PMH as noted above including hypertension, COPD, and hepatic steatosis presents from his nursing facility with fever and borderline low O2 saturation noted today.  The patient himself denies any significant complaints.  I reviewed the past medical records in Epic and Care Everywhere.  The patient is followed by palliative care due to debility related to COPD and dysphagia.  He had a prolonged admission in early 2022 at [facility] health for E. 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  On exam the patient is overall comfortable appearing.  His vital signs are normal except for tachycardia and fever.   The physical exam is otherwise unremarkable for acute findings.  Differential includes recurrent UTI, COVID-19, pneumonia, or other source of infection.  Code sepsis has been activated.  I have ordered empiric fluids and antibiotics.  We will obtain labs and x-ray and reassess.  ----------------------------------------- 3:31 AM on 03/31/2021 -----------------------------------------  Urine, chest x-ray, and COVID swab are all negative.  On reassessment, the patient appears well.  When asked specifically, he does endorse a mild left-sided headache (though he denies a headache or any other acute pain earlier) but has no neck pain or stiffness.  He is moving his neck freely and the neck is supple with no meningeal signs.  He is not altered.  Therefore, I do not have any suspicion for meningitis.  He will need admission for further work-up for fever/sepsis of unknown origin.  I consulted Dr. Arville Care from the hospitalist service for admission.   ____________________________________________   FINAL CLINICAL IMPRESSION(S) / ED DIAGNOSES  Final diagnoses:  Sepsis, due to unspecified organism, unspecified whether acute organ dysfunction present (HCC)  Febrile illness      NEW MEDICATIONS STARTED DURING THIS VISIT:  New Prescriptions  No medications on file     Note:  This document was prepared using Dragon voice recognition software and may include unintentional dictation errors.    Dionne Bucy, MD 03/31/21 [phone]

## 2021-03-31 NOTE — Progress Notes (Signed)
CODE SEPSIS - PHARMACY COMMUNICATION  **Broad Spectrum Antibiotics should be administered within 1 hour of Sepsis diagnosis**  Time Code Sepsis Called/Page Received: 8/30 @ 0151  Antibiotics Ordered:  Vancomycin, Cefepime   Time of 1st antibiotic administration:  Vanc 1 gm IV X 1 @ 0252.   Additional action taken by pharmacy:   If necessary, Name of Provider/Nurse Contacted:     Dereona Kolodny D ,PharmD Clinical Pharmacist  03/31/2021  5:16 AM

## 2021-04-01 ENCOUNTER — Other Ambulatory Visit: Payer: Self-pay

## 2021-04-01 DIAGNOSIS — R509 Fever, unspecified: Principal | ICD-10-CM

## 2021-04-01 DIAGNOSIS — E872 Acidosis: Secondary | ICD-10-CM

## 2021-04-01 LAB — CBC
HCT: 28.4 % — ABNORMAL LOW (ref 39.0–52.0)
Hemoglobin: 8.9 g/dL — ABNORMAL LOW (ref 13.0–17.0)
MCH: 24.2 pg — ABNORMAL LOW (ref 26.0–34.0)
MCHC: 31.3 g/dL (ref 30.0–36.0)
MCV: 77.2 fL — ABNORMAL LOW (ref 80.0–100.0)
Platelets: 168 10*3/uL (ref 150–400)
RBC: 3.68 MIL/uL — ABNORMAL LOW (ref 4.22–5.81)
RDW: 19.3 % — ABNORMAL HIGH (ref 11.5–15.5)
WBC: 11.5 10*3/uL — ABNORMAL HIGH (ref 4.0–10.5)
nRBC: 0 % (ref 0.0–0.2)

## 2021-04-01 LAB — BASIC METABOLIC PANEL
Anion gap: 7 (ref 5–15)
BUN: 14 mg/dL (ref 8–23)
CO2: 21 mmol/L — ABNORMAL LOW (ref 22–32)
Calcium: 8.9 mg/dL (ref 8.9–10.3)
Chloride: 109 mmol/L (ref 98–111)
Creatinine, Ser: 0.71 mg/dL (ref 0.61–1.24)
GFR, Estimated: >60 mL/min (ref >60)
Glucose, Bld: 98 mg/dL (ref 70–99)
Potassium: 3.5 mmol/L (ref 3.5–5.1)
Sodium: 137 mmol/L (ref 135–145)

## 2021-04-01 LAB — PROTIME-INR
INR: 1.2 (ref 0.8–1.2)
Prothrombin Time: 15 seconds (ref 11.4–15.2)

## 2021-04-01 LAB — CORTISOL-AM, BLOOD: Cortisol - AM: 4.2 ug/dL — ABNORMAL LOW (ref 6.7–22.6)

## 2021-04-01 LAB — PROCALCITONIN: Procalcitonin: 0.26 ng/mL

## 2021-04-01 MED ORDER — GUAIFENESIN ER 600 MG PO TB12
600.0000 mg | ORAL_TABLET | Freq: Two times a day (BID) | ORAL | Status: DC
  Administered 2021-04-01 – 2021-04-02 (×3): 600 mg via ORAL
  Filled 2021-04-01 (×3): qty 1

## 2021-04-01 MED ORDER — DIPHENHYDRAMINE HCL 25 MG PO CAPS
25.0000 mg | ORAL_CAPSULE | Freq: Four times a day (QID) | ORAL | Status: DC | PRN
  Administered 2021-04-01 (×2): 25 mg via ORAL
  Filled 2021-04-01 (×2): qty 1

## 2021-04-01 NOTE — Progress Notes (Signed)
Patient IV came out in left a/c cat herter intact, site clean dry and intact

## 2021-04-01 NOTE — Progress Notes (Signed)
  DAY ZERO NOTE    Ryan Dunlap  DXA:[id number] DOB: Jun 06, 1956 DOA: 03/31/2021 PCP: Pcp, No  Day 0 note, for complete history please see H&P earlier this morning  Brief Narrative:  Ryan Dunlap is a 65 y.o. male with medical history significant for alcoholic liver cirrhosis, hypertension and COPD, who presented to the emergency room with acute onset of fever 101.6 at facility without overt source, patient does meet SIRS criteria.  Given concern for acute infection admitted overnight.  Assessment & Plan:  Systemic inflammatory response syndrome as manifested by fever, tachycardia and tachypnea without clear source, does not meet sepsis criteria.   Concurrent lactic acidosis Unclear infectious source but presumed POA  -Blood culture no growth, chest x-ray no acute findings, UA no bacteria seen -He does not appear to have ascites, no abdominal pain -DC vancomycin and Flagyl , continue cefepime for now -Monitor lactic acid, CBC, procalcitonin  COPD without acute hypoxia or flare. -Continue supportive care, nebs   DVT prophylaxis: Lovenox. Code Status: full code. Family Communication: None at bedside  Status is: Inpatient  Dispo: The patient is from: Facility              Anticipated d/c is to: Name              Anticipated d/c date is: on 9/1                Consultants:  None  Procedures:  None  Antimicrobials:  Cefepime,  ongoing vancomycin, Flagyl 03/30/2021 to 8/31  Subjective:  Reported feeling better, fever and tachycardia has resolved He does occasionally have dry cough during encounter  Objective: Vitals:   04/01/21 0339 04/01/21 0721 04/01/21 0936 04/01/21 1328  BP: 114/73 131/81 116/75 128/80  Pulse: 79 85  85  Resp: 16 16    Temp: 98.8 F (37.1 C) 98.8 F (37.1 C)  98 F (36.7 C)  TempSrc: Oral   Oral  SpO2: 98% 100%  100%  Weight:      Height:        Intake/Output Summary (Last 24 hours) at 04/01/2021 1842 Last data filed at 04/01/2021 1403 Gross  per 24 hour  Intake 3270.79 ml  Output 3000 ml  Net 270.79 ml   Filed Weights   03/31/21 0534  Weight: 87.8 kg    Examination:  General:  Pleasantly resting in bed, No acute distress. HEENT:  Normocephalic atraumatic.  Sclerae nonicteric, noninjected.  Extraocular movements intact bilaterally. Neck:  Without mass or deformity.  Trachea is midline. Lungs:  Clear to auscultate bilaterally without rhonchi, wheeze, or rales. Heart:  Regular rate and rhythm.  Without murmurs, rubs, or gallops. Abdomen:  Soft, nontender, nondistended.  Without guarding or rebound. Extremities: Without cyanosis, clubbing, edema, or obvious deformity. Vascular:  Dorsalis pedis and posterior tibial pulses palpable bilaterally. Skin:  Warm and dry, no erythema, no ulcerations.   Data Reviewed: I have personally reviewed following labs and imaging studies   Albertine Grates, MD PhD FACP [facility]  If 7PM-7AM, please contact night-coverage [email or web address]  04/01/2021, 6:42 PM

## 2021-04-01 NOTE — Plan of Care (Signed)

## 2021-04-01 NOTE — Evaluation (Signed)
Physical Therapy Evaluation Patient Details Name: Ryan Dunlap MRN: [id number] DOB: 1956/03/18 Today's Date: 04/01/2021   History of Present Illness  65 y.o. male with medical history significant for alcoholic liver cirrhosis, hypertension and COPD, who presented to the emergency room from a facility with acute onset of fever that was up to 101.6 with associated generalized weaknes.  Pt had a prolonged hospitalization Jan-April earlier this year.  Clinical Impression  Pt able and willing to participate with PT this afternoon.  He reports that prior to prolonged hospitalization earlier this year he was able to live alone (he is partially blind but was able to take bus, etc to run errands).  He states that he has been essentially w/c bound with minimal (10 ish feet) ambulation but today was able to walk ~75 ft w/ some fatigue and LE coordination issues but overall did surprisingly well.  Pt will benefit from continued work with PT, recommending PT in house and at discharge.    Follow Up Recommendations Supervision/Assistance - 24 hour;SNF    Equipment Recommendations  None recommended by PT    Recommendations for Other Services       Precautions / Restrictions Precautions Precautions: Fall Restrictions Weight Bearing Restrictions: No      Mobility  Bed Mobility Overal bed mobility: Modified Independent             General bed mobility comments: heavy use of rails, but pt able to get supine to sit w/o assist    Transfers Overall transfer level: Needs assistance Equipment used: Rolling walker (2 wheeled) Transfers: Sit to/from Stand Sit to Stand: Min assist         General transfer comment: Pt is tall, elevated be 1-2" inches, however even with appropriate UE use and set up he was unable to rise w/o direct assist from PT to rise and keep weight shifting forward  Ambulation/Gait Ambulation/Gait assistance: Min guard Gait Distance (Feet): 75 Feet Assistive device: Rolling  walker (2 wheeled)       General Gait Details: Pt was able to rise to ambulate with surprising confidence given his report of only very minimal distance ambulation recently.  His HR did rise to ~120, though O2 remained 90s on room air.  Pt with no buckling but did have decreased ankle/foot coordiantion with toe drag/shuffling as well as heavy UE use (and forward lean) on the walker.  No LOBs but consistent display of coordiantion/weakness issues R>L in LEs  Stairs            Wheelchair Mobility    Modified Rankin (Stroke Patients Only)       Balance Overall balance assessment: Needs assistance Sitting-balance support: Single extremity supported Sitting balance-Leahy Scale: Good     Standing balance support: Bilateral upper extremity supported Standing balance-Leahy Scale: Fair Standing balance comment: reliant on walker and displaying decreased dynamic quality of motion and foot/LE coordination issues                             Pertinent Vitals/Pain Pain Assessment: No/denies pain    Home Living Family/patient expects to be discharged to:: Skilled nursing facility                 Additional Comments: Pt talking of how he was trying to find a new place but had no further information or actual plan... seems he was likely to be LTC at his current facility?    Prior Function Level  of Independence: Needs assistance   Gait / Transfers Assistance Needed: pt reports he uses w/c for most mobility, will take a few steps getting around in room  ADL's / Homemaking Assistance Needed: unclear, he indicated that he was relatively independent 6 months ago, appears that more recently he has need a lot for assist with all ADLs, etc        Hand Dominance        Extremity/Trunk Assessment   Upper Extremity Assessment Upper Extremity Assessment: Generalized weakness    Lower Extremity Assessment Lower Extremity Assessment: Generalized weakness (L LE grossly  3+/5, R 4-/5)       Communication   Communication: No difficulties  Cognition Arousal/Alertness: Awake/alert Behavior During Therapy: Restless;WFL for tasks assessed/performed Overall Cognitive Status: Difficult to assess                                 General Comments: Pt able to seemingly answer some questions appropriately and then would quickly get off topic and or be unable to answer simple questions      General Comments      Exercises     Assessment/Plan    PT Assessment Patient needs continued PT services  PT Problem List Decreased strength;Decreased range of motion;Decreased activity tolerance;Decreased balance;Decreased mobility;Decreased safety awareness;Decreased knowledge of use of DME;Decreased coordination       PT Treatment Interventions DME instruction;Gait training;Functional mobility training;Therapeutic activities;Therapeutic exercise;Balance training;Neuromuscular re-education;Patient/family education    PT Goals (Current goals can be found in the Care Plan section)       Frequency Min 2X/week   Barriers to discharge        Co-evaluation               AM-PAC PT "6 Clicks" Mobility  Outcome Measure Help needed turning from your back to your side while in a flat bed without using bedrails?: None Help needed moving from lying on your back to sitting on the side of a flat bed without using bedrails?: A Little Help needed moving to and from a bed to a chair (including a wheelchair)?: A Little Help needed standing up from a chair using your arms (e.g., wheelchair or bedside chair)?: A Little Help needed to walk in hospital room?: A Lot   6 Click Score: 15    End of Session Equipment Utilized During Treatment: Gait belt Activity Tolerance: Patient tolerated treatment well Patient left: with call bell/phone within reach;with chair alarm set Nurse Communication: Mobility status PT Visit Diagnosis: Muscle weakness (generalized)  (M62.81);Difficulty in walking, not elsewhere classified (R26.2)    Time: 4707-6151 PT Time Calculation (min) (ACUTE ONLY): 26 min   Charges:   PT Evaluation $PT Eval Low Complexity: 1 Low PT Treatments $Gait Training: 8-22 mins        Malachi Pro, DPT 04/01/2021, 3:47 PM

## 2021-04-01 NOTE — Progress Notes (Signed)
Pharmacy Antibiotic Note   Ryan Dunlap is a 65 y.o. male admitted on 03/31/2021 with sepsis. Patient presented to the ED with fever, tachycardia, tachypnea, and WBC of 11.5 without clear source of infection. Pharmacy has been consulted for cefepime and vancomycin dosing. Cefepime 2 gm IV X 1 given in ED on 8/30 @ 430.  On day #2 of antibiotics. Blood cultures pending. Patient has been afebrile for 24 hours with improved RR to 16. Renal function remains stable.   Plan:  Continue cefepime 2 gm IV every 8 hours. Continue vancomycin 1500 mg IV every 12 hours.  AUC = 495.8 Vanc trough = 12.8  Cmax 34 Cmin 12.7 Calculated using Scr 0.8, Vd 0.72   Height: 6\' 3"  (190.5 cm) Weight: 87.8 kg (193 lb 9 oz) IBW/kg (Calculated) : 84.5  Temp (24hrs), Avg:98.7 F (37.1 C), Min:98.4 F (36.9 C), Max:98.9 F (37.2 C)  Recent Labs  Lab 03/31/21 0154 03/31/21 0400 03/31/21 0637 04/01/21 0535  WBC 11.3*  --   --  11.5*  CREATININE 0.75  --   --   --   LATICACIDVEN 2.1* 2.2* 2.0*  --      Estimated Creatinine Clearance: 110 mL/min (by C-G formula based on SCr of 0.75 mg/dL).    Allergies  Allergen Reactions   Ibuprofen    Lisinopril    Losartan     Antimicrobials this admission:  8/30 vancomycin >>   8/30 cefepime >>   Dose adjustments this admission: None   Microbiology results:  BCx: [id number]   Thank you for allowing pharmacy to be a part of this patient's care.  9/30, PharmD Pharmacy Resident  04/01/2021 7:42 AM

## 2021-04-02 DIAGNOSIS — D72829 Elevated white blood cell count, unspecified: Secondary | ICD-10-CM

## 2021-04-02 LAB — CBC WITH DIFFERENTIAL/PLATELET
Abs Immature Granulocytes: 0.01 10*3/uL (ref 0.00–0.07)
Basophils Absolute: 0 10*3/uL (ref 0.0–0.1)
Basophils Relative: 0 %
Eosinophils Absolute: 0.4 10*3/uL (ref 0.0–0.5)
Eosinophils Relative: 4 %
HCT: 29 % — ABNORMAL LOW (ref 39.0–52.0)
Hemoglobin: 9.3 g/dL — ABNORMAL LOW (ref 13.0–17.0)
Immature Granulocytes: 0 %
Lymphocytes Relative: 34 %
Lymphs Abs: 3.2 10*3/uL (ref 0.7–4.0)
MCH: 24.5 pg — ABNORMAL LOW (ref 26.0–34.0)
MCHC: 32.1 g/dL (ref 30.0–36.0)
MCV: 76.5 fL — ABNORMAL LOW (ref 80.0–100.0)
Monocytes Absolute: 1 10*3/uL (ref 0.1–1.0)
Monocytes Relative: 10 %
Neutro Abs: 4.8 10*3/uL (ref 1.7–7.7)
Neutrophils Relative %: 52 %
Platelets: 178 10*3/uL (ref 150–400)
RBC: 3.79 MIL/uL — ABNORMAL LOW (ref 4.22–5.81)
RDW: 18.9 % — ABNORMAL HIGH (ref 11.5–15.5)
WBC: 9.4 10*3/uL (ref 4.0–10.5)
nRBC: 0 % (ref 0.0–0.2)

## 2021-04-02 LAB — COMPREHENSIVE METABOLIC PANEL
ALT: 25 U/L (ref 0–44)
AST: 31 U/L (ref 15–41)
Albumin: 3.5 g/dL (ref 3.5–5.0)
Alkaline Phosphatase: 117 U/L (ref 38–126)
Anion gap: 7 (ref 5–15)
BUN: 17 mg/dL (ref 8–23)
CO2: 18 mmol/L — ABNORMAL LOW (ref 22–32)
Calcium: 9.4 mg/dL (ref 8.9–10.3)
Chloride: 111 mmol/L (ref 98–111)
Creatinine, Ser: 0.72 mg/dL (ref 0.61–1.24)
GFR, Estimated: >60 mL/min (ref >60)
Glucose, Bld: 110 mg/dL — ABNORMAL HIGH (ref 70–99)
Potassium: 3.6 mmol/L (ref 3.5–5.1)
Sodium: 136 mmol/L (ref 135–145)
Total Bilirubin: 0.6 mg/dL (ref 0.3–1.2)
Total Protein: 7.8 g/dL (ref 6.5–8.1)

## 2021-04-02 LAB — PROCALCITONIN: Procalcitonin: 0.16 ng/mL

## 2021-04-02 LAB — MAGNESIUM: Magnesium: 1.6 mg/dL — ABNORMAL LOW (ref 1.7–2.4)

## 2021-04-02 LAB — LACTIC ACID, PLASMA: Lactic Acid, Venous: 1.2 mmol/L (ref 0.5–1.9)

## 2021-04-02 MED ORDER — GUAIFENESIN ER 600 MG PO TB12: 600.0000 mg | ORAL_TABLET | Freq: Two times a day (BID) | ORAL | 0 refills | Status: AC

## 2021-04-02 MED ORDER — AMOXICILLIN-POT CLAVULANATE 875-125 MG PO TABS: 1.0000 | ORAL_TABLET | Freq: Two times a day (BID) | ORAL | 0 refills | Status: AC

## 2021-04-02 MED ORDER — MAGNESIUM SULFATE 2 GM/50ML IV SOLN
2.0000 g | Freq: Once | INTRAVENOUS | Status: AC
  Administered 2021-04-02: 2 g via INTRAVENOUS
  Filled 2021-04-02: qty 50

## 2021-04-02 NOTE — TOC Progression Note (Signed)
Transition of Care [facility]) - Progression Note    Patient Details  Name: Ryan Dunlap MRN: [id number] Date of Birth: 04/20/1956  Transition of Care [facility]) CM/SW Contact  Allayne Butcher, RN Phone Number: 04/02/2021, 2:21 PM  Clinical Narrative:    DC summary completed.  [facility] arranged for transport.    Expected Discharge Plan: Skilled Nursing Facility Barriers to Discharge: Barriers Resolved  Expected Discharge Plan and Services Expected Discharge Plan: Skilled Nursing Facility   Discharge Planning Services: CM Consult Post Acute Care Choice: Skilled Nursing Facility, Resumption of Svcs/PTA Provider Living arrangements for the past 2 months: Skilled Nursing Facility Expected Discharge Date: 04/02/21               DME Arranged: N/A DME Agency: NA       HH Arranged: NA           Social Determinants of Health (SDOH) Interventions    Readmission Risk Interventions No flowsheet data found.

## 2021-04-02 NOTE — NC FL2 (Signed)
[place] MEDICAID FL2 LEVEL OF CARE SCREENING TOOL     IDENTIFICATION  Patient Name: Ryan Dunlap Birthdate: September 05, 1955 Sex: male Admission Date (Current Location): 03/31/2021  [facility] and [place] Number:  Chiropodist and Address:  [facility], [address], [place], [place] [address]      Provider Number: [id number]  Attending Physician Name and Address:  Albertine Grates, MD  Relative Name and Phone Number:  Aquilla Voiles (brother)  [phone]    Current Level of Care: Hospital Recommended Level of Care: Skilled Nursing Facility Prior Approval Number:    Date Approved/Denied:   PASRR Number:    Discharge Plan: SNF    Current Diagnoses: Patient Active Problem List   Diagnosis Date Noted   SIRS (systemic inflammatory response syndrome) (HCC) 03/31/2021    Orientation RESPIRATION BLADDER Height & Weight     Self, Time, Situation, Place  Normal Incontinent Weight: 87.8 kg Height:  6\' 3"  (190.5 cm)  BEHAVIORAL SYMPTOMS/MOOD NEUROLOGICAL BOWEL NUTRITION STATUS      Incontinent Diet (see discharge summary)  AMBULATORY STATUS COMMUNICATION OF NEEDS Skin   Limited Assist Verbally Normal                       Personal Care Assistance Level of Assistance  Bathing, Feeding, Dressing Bathing Assistance: Limited assistance Feeding assistance: Limited assistance Dressing Assistance: Limited assistance     Functional Limitations Info             SPECIAL CARE FACTORS FREQUENCY  PT (By licensed PT), OT (By licensed OT)     PT Frequency: min 2 times per week OT Frequency: min 2 times per week            Contractures Contractures Info: Not present    Additional Factors Info  Code Status, Allergies Code Status Info: Full Allergies Info: Ibuprofen, lisinopril, losartan           Current Medications (04/02/2021):  This is the current hospital active medication list Current Facility-Administered Medications  Medication Dose  Route Frequency Provider Last Rate Last Admin   0.9 %  sodium chloride infusion   Intravenous Continuous Mansy, Jan A, MD 100 mL/hr at 04/02/21 0628 New Bag at 04/02/21 0628   acetaminophen (TYLENOL) tablet 650 mg  650 mg Oral Q6H PRN Mansy, Jan A, MD       Or   acetaminophen (TYLENOL) suppository 650 mg  650 mg Rectal Q6H PRN Mansy, Jan A, MD       amLODipine (NORVASC) tablet 10 mg  10 mg Oral Daily Mansy, Jan A, MD   10 mg at 04/01/21 04/03/21   bisacodyl (FLEET) enema 10 mg  10 mg Rectal Once Mansy, Jan A, MD       ceFEPIme (MAXIPIME) 2 g in sodium chloride 0.9 % 100 mL IVPB  2 g Intravenous Q8H Mansy, Jan A, MD 200 mL/hr at 04/02/21 0628 2 g at 04/02/21 06/02/21   cholecalciferol (VITAMIN D3) tablet 500 Units  500 Units Oral Daily Mansy, Jan A, MD   500 Units at 04/01/21 04/03/21   diclofenac Sodium (VOLTAREN) 1 % topical gel 2 g  2 g Topical QID Mansy, Jan A, MD   2 g at 04/01/21 2131   diphenhydrAMINE (BENADRYL) capsule 25 mg  25 mg Oral Q6H PRN 2132, NP   25 mg at 04/01/21 1839   enoxaparin (LOVENOX) injection 40 mg  40 mg Subcutaneous Q24H 04/03/21, MD  40 mg at 04/01/21 2131   folic acid (FOLVITE) tablet 1 mg  1 mg Oral Daily Mansy, Jan A, MD   1 mg at 04/01/21 1324   guaiFENesin (MUCINEX) 12 hr tablet 600 mg  600 mg Oral BID Albertine Grates, MD   600 mg at 04/01/21 2130   magnesium hydroxide (MILK OF MAGNESIA) suspension 30 mL  30 mL Oral Daily PRN Mansy, Jan A, MD       magnesium sulfate IVPB 2 g 50 mL  2 g Intravenous Once Albertine Grates, MD       multivitamin with minerals tablet 1 tablet  1 tablet Oral Daily Mansy, Jan A, MD   1 tablet at 04/01/21 0937   ondansetron [facility]) tablet 4 mg  4 mg Oral Q6H PRN Mansy, Jan A, MD       Or   ondansetron [facility]) injection 4 mg  4 mg Intravenous Q6H PRN Mansy, Jan A, MD       pantoprazole (PROTONIX) EC tablet 40 mg  40 mg Oral Daily Mansy, Jan A, MD   40 mg at 04/01/21 4010   thiamine tablet 100 mg  100 mg Oral Daily Mansy, Jan A, MD   100  mg at 04/01/21 0937   tiotropium [facility]) inhalation capsule [facility] use ONLY) 18 mcg  1 capsule Inhalation Daily Mansy, Jan A, MD   18 mcg at 04/01/21 2725   traMADol (ULTRAM) tablet 25 mg  25 mg Oral Q6H PRN Mansy, Jan A, MD       traZODone (DESYREL) tablet 25 mg  25 mg Oral QHS PRN Mansy, Jan A, MD   25 mg at 04/01/21 2131     Discharge Medications: Please see discharge summary for a list of discharge medications.  Relevant Imaging Results:  Relevant Lab Results:   Additional Information SS# [id number]  Allayne Butcher, RN

## 2021-04-02 NOTE — TOC Transition Note (Signed)
Transition of Care [facility]) - CM/SW Discharge Note   Patient Details  Name: Ryan Dunlap MRN: [id number] Date of Birth: October 20, 1955  Transition of Care [facility]) CM/SW Contact:  Allayne Butcher, RN Phone Number: 04/02/2021, 9:57 AM   Clinical Narrative:    Patient is from [facility] in [place] [facility].  Patient is medically cleared for discharge today and will return to [facility].  Once discharge has been completed RNCM will arranged EMS transport.    Final next level of care: Skilled Nursing Facility Barriers to Discharge: Barriers Resolved   Patient Goals and CMS Choice   CMS Medicare.gov Compare Post Acute Care list provided to:: Patient Choice offered to / list presented to : Patient  Discharge Placement                       Discharge Plan and Services   Discharge Planning Services: CM Consult Post Acute Care Choice: Skilled Nursing Facility, Resumption of Svcs/PTA Provider          DME Arranged: N/A DME Agency: NA       HH Arranged: NA          Social Determinants of Health (SDOH) Interventions     Readmission Risk Interventions No flowsheet data found.

## 2021-04-02 NOTE — Discharge Summary (Addendum)
Discharge Summary  Ryan Dunlap NFA:[id number] DOB: Aug 11, 1955  PCP: Pcp, No  Admit date: 03/31/2021 Discharge date: 04/02/2021  Time spent: 14mns, more than 50% time spent on coordination of care.  Recommendations for Outpatient Follow-up:  F/u with SNF MD or hospital discharge follow up, repeat cbc/bmp at follow up    Discharge Diagnoses:  Active Hospital Problems   Diagnosis Date Noted   [place]) [place] 03/31[place]    Resolved Hospital Problems  No resolved problems to display.    Discharge Condition: stable  Diet recommendation: heart healthy  Filed Weights   03/31/21 0534  Weight: 87.8 kg    History of present illness: (Per admitting MD Dr. MSidney Ace CGarrell Flaggis a 65y.o. male with medical history significant for alcoholic liver cirrhosis, hypertension and COPD, who presented to the emergency room with acute onset of fever that was up to one 1.6 with associated generalized weakness.  The patient was noted to have approximately 92% on room air.  He was given Tylenol by EMS.  He denies any dyspnea or wheezing.  He admits to occasional dry cough.  No nausea or vomiting or diarrhea or abdominal pain.  No dysuria, oliguria or hematuria or flank pain.  No sore throat or earache.  No chest pain or palpitations.   ED Course: Upon presentation to the emergency room blood pressure was 147/91 with a temperature of 102.7 heart rate of 118 and respirate of 17 and later 27.  Labs revealed unremarkable CMP except for alk phos 136.  High-sensitivity troponin I was 7 and there 11.  Lactic acid was 2.1 and later 2.2 CBC showed mild leukocytosis 11.3 with neutrophilia and anemia.  Influenza antigens and COVID-19 PCR came back negative.  UA was negative.  Blood cultures was drawn. EKG as reviewed by me : Showed sinus tachycardia with a rate of 125 Imaging: Chest x-ray showed no acute cardiopulmonary disease.  The patient was given IV vancomycin and cefepime  as well as 1 L bolus of IV lactated Ringer.  He will be admitted to a medical monitored bed for further evaluation and management left total knee arthroplasty  Hospital Course:  Active Problems:   SIRS (systemic inflammatory response syndrome) (HCC)  Systemic inflammatory response syndrome as manifested by fever, tachycardia and tachypnea without clear source, does not meet sepsis criteria.   Concurrent lactic acidosis Unclear infectious source but presumed POA  -Blood culture no growth, chest x-ray no acute findings, UA no bacteria seen -He does not appear to have ascites, no abdominal pain -DC vancomycin and Flagyl , continue cefepime for now -he improved on abx treatment, lactic acidosis resolved, leukocytosis resolved .will discharge on augmentin for three days to finish total of 5 days treatment   COPD without acute hypoxia or flare. -Continue supportive care, nebs  Hypomagnesemia Replace mag  Microcytic anemia Hgb around 9 Outpatient anemia work-up per SNF MD  Anti-infectives (From admission, onward)    Start     Dose/Rate Route Frequency Ordered Stop   04/02/21 0000  amoxicillin-clavulanate (AUGMENTIN) 875-125 MG tablet        1 tablet Oral 2 times daily 04/02/21 1356 04/05/21 2359   03/31/21 1500  vancomycin (VANCOREADY) IVPB 1500 mg/300 mL  Status:  Discontinued        1,500 mg 150 mL/hr over 120 Minutes Intravenous Every 12 hours 03/31/21 0558 04/01/21 1836   03/31/21 1200  ceFEPIme (MAXIPIME) 2 g in sodium chloride 0.9 % 100 mL IVPB  2 g 200 mL/hr over 30 Minutes Intravenous Every 8 hours 03/31/21 0550     03/31/21 0600  vancomycin (VANCOCIN) IVPB 1000 mg/200 mL premix        1,000 mg 200 mL/hr over 60 Minutes Intravenous  Once 03/31/21 0552 03/31/21 0726   03/31/21 0515  ceFEPIme (MAXIPIME) 2 g in sodium chloride 0.9 % 100 mL IVPB  Status:  Discontinued        2 g 200 mL/hr over 30 Minutes Intravenous  Once 03/31/21 0503 03/31/21 0513   03/31/21 0515   metroNIDAZOLE (FLAGYL) IVPB 500 mg  Status:  Discontinued        500 mg 100 mL/hr over 60 Minutes Intravenous Every 12 hours 03/31/21 0503 04/01/21 1836   03/31/21 0515  vancomycin (VANCOCIN) IVPB 1000 mg/200 mL premix  Status:  Discontinued        1,000 mg 200 mL/hr over 60 Minutes Intravenous  Once 03/31/21 0503 03/31/21 0515   03/31/21 0200  ceFEPIme (MAXIPIME) 2 g in sodium chloride 0.9 % 100 mL IVPB        2 g 200 mL/hr over 30 Minutes Intravenous  Once 03/31/21 0151 03/31/21 0436   03/31/21 0200  vancomycin (VANCOCIN) IVPB 1000 mg/200 mL premix        1,000 mg 200 mL/hr over 60 Minutes Intravenous  Once 03/31/21 0151 03/31/21 0358       Discharge Exam: BP (!) 154/98 (BP Location: Left Arm)   Pulse 90   Temp 98.1 F (36.7 C)   Resp 19   Ht 6' 3"  (1.905 m)   Wt 87.8 kg   SpO2 99%   BMI 24.19 kg/m   General: NAD, pleasant, and cooperative, report feeling better she is happy to go back to the facility Cardiovascular: RRR Respiratory: Normal respiratory effort  Discharge Instructions You were cared for by a hospitalist during your hospital stay. If you have any questions about your discharge medications or the care you received while you were in the hospital after you are discharged, you can call the unit and asked to speak with the hospitalist on call if the hospitalist that took care of you is not available. Once you are discharged, your primary care physician will handle any further medical issues. Please note that NO REFILLS for any discharge medications will be authorized once you are discharged, as it is imperative that you return to your primary care physician (or establish a relationship with a primary care physician if you do not have one) for your aftercare needs so that they can reassess your need for medications and monitor your lab values.  Discharge Instructions     Diet - low sodium heart healthy   Complete by: As directed    Increase activity slowly   Complete  by: As directed       Allergies as of 04/02/2021       Reactions   Ibuprofen    Lisinopril    Losartan         Medication List     TAKE these medications    acetaminophen 500 MG tablet Commonly known as: TYLENOL Take 1,000 mg by mouth every 8 (eight) hours as needed.   amLODipine 10 MG tablet Commonly known as: NORVASC Take 10 mg by mouth daily.   amoxicillin-clavulanate 875-125 MG tablet Commonly known as: Augmentin Take 1 tablet by mouth 2 (two) times daily for 3 days.   bisacodyl 10 MG/30ML Enem Commonly known as: FLEET Place 10 mg rectally  once.   diclofenac Sodium 1 % Gel Commonly known as: VOLTAREN Apply 2 g topically 4 (four) times daily.   folic acid 1 MG tablet Commonly known as: FOLVITE Take 1 mg by mouth daily.   guaiFENesin 600 MG 12 hr tablet Commonly known as: MUCINEX Take 1 tablet (600 mg total) by mouth 2 (two) times daily for 7 days.   multivitamin with minerals Tabs tablet Take 1 tablet by mouth daily.   omeprazole 20 MG capsule Commonly known as: PRILOSEC Take 20 mg by mouth daily.   thiamine 100 MG tablet Take 100 mg by mouth daily.   Tiotropium Bromide Monohydrate 2.5 MCG/ACT Aers Inhale 1 puff into the lungs daily.   traMADol 50 MG tablet Commonly known as: ULTRAM Take 25 mg by mouth every 6 (six) hours as needed.   Vitamin D2 10 MCG (400 UNIT) Tabs Take 1 tablet by mouth daily.       Allergies  Allergen Reactions   Ibuprofen    Lisinopril    Losartan       The results of significant diagnostics from this hospitalization (including imaging, microbiology, ancillary and laboratory) are listed below for reference.    Significant Diagnostic Studies: DG Chest Portable 1 View  Result Date: 03/31/2021 CLINICAL DATA:  Fever, weakness EXAM: PORTABLE CHEST 1 VIEW COMPARISON:  None. FINDINGS: Lungs are essentially clear. Mild right basilar opacity, likely atelectasis. No pleural effusion or pneumothorax. The heart is normal  in size.  Thoracic aortic atherosclerosis. IMPRESSION: No evidence of acute cardiopulmonary disease. Electronically Signed   By: Julian Hy M.D.   On: 03/31/2021 02:02    Microbiology: Recent Results (from the past 240 hour(s))  Resp Panel by RT-PCR (Flu A&B, Covid) Urine, Clean Catch     Status: None   Collection Time: 03/31/21  2:09 AM   Specimen: Urine, Clean Catch; Nasopharyngeal(NP) swabs in vial transport medium  Result Value Ref Range Status   SARS Coronavirus 2 by RT PCR NEGATIVE NEGATIVE Final    Comment: (NOTE) SARS-CoV-2 target nucleic acids are NOT DETECTED.  The SARS-CoV-2 RNA is generally detectable in upper respiratory specimens during the acute phase of infection. The lowest concentration of SARS-CoV-2 viral copies this assay can detect is 138 copies/mL. A negative result does not preclude SARS-Cov-2 infection and should not be used as the sole basis for treatment or other patient management decisions. A negative result may occur with  improper specimen collection/handling, submission of specimen other than nasopharyngeal swab, presence of viral mutation(s) within the areas targeted by this assay, and inadequate number of viral copies(<138 copies/mL). A negative result must be combined with clinical observations, patient history, and epidemiological information. The expected result is Negative.  Fact Sheet for Patients:  EntrepreneurPulse.com.au  Fact Sheet for Healthcare Providers:  IncredibleEmployment.be  This test is no t yet approved or cleared by the [place] FDA and  has been authorized for detection and/or diagnosis of SARS-CoV-2 by FDA under an Emergency Use Authorization (EUA). This EUA will remain  in effect (meaning this test can be used) for the duration of the COVID-19 declaration under Section 564(b)(1) of the Act, 21 U.S.C.section 360bbb-3(b)(1), unless the authorization is terminated  or revoked  sooner.       Influenza A by PCR NEGATIVE NEGATIVE Final   Influenza B by PCR NEGATIVE NEGATIVE Final    Comment: (NOTE) The Xpert Xpress SARS-CoV-2/FLU/RSV plus assay is intended as an aid in the diagnosis of influenza from Nasopharyngeal swab specimens and  should not be used as a sole basis for treatment. Nasal washings and aspirates are unacceptable for Xpert Xpress SARS-CoV-2/FLU/RSV testing.  Fact Sheet for Patients: EntrepreneurPulse.com.au  Fact Sheet for Healthcare Providers: IncredibleEmployment.be  This test is not yet approved or cleared by the [place] FDA and has been authorized for detection and/or diagnosis of SARS-CoV-2 by FDA under an Emergency Use Authorization (EUA). This EUA will remain in effect (meaning this test can be used) for the duration of the COVID-19 declaration under Section 564(b)(1) of the Act, 21 U.S.C. section 360bbb-3(b)(1), unless the authorization is terminated or revoked.  Performed at [facility], [address] [place], [place] [address]   Culture, blood (Routine x 2)     Status: None (Preliminary result)   Collection Time: 03/31/21  5:48 AM   Specimen: BLOOD  Result Value Ref Range Status   Specimen Description BLOOD LEFT AC  Final   Special Requests   Final    BOTTLES DRAWN AEROBIC AND ANAEROBIC Blood Culture adequate volume   Culture   Final    NO GROWTH 2 DAYS Performed at [facility], [address] [place], [place] [address]    Report Status PENDING  Incomplete  Culture, blood (Routine x 2)     Status: None (Preliminary result)   Collection Time: 03/31/21  5:48 AM   Specimen: BLOOD  Result Value Ref Range Status   Specimen Description BLOOD RIGHT HAND  Final   Special Requests   Final    BOTTLES DRAWN AEROBIC AND ANAEROBIC Blood Culture adequate volume   Culture   Final    NO GROWTH 2 DAYS Performed at [facility], [address]  [place], [place] [address]    Report Status PENDING  Incomplete     Labs: Basic Metabolic Panel: Recent Labs  Lab 03/31/21 0154 04/01/21 0535 04/02/21 0410  NA 135 137 136  K 3.6 3.5 3.6  CL 107 109 111  CO2 17* 21* 18*  GLUCOSE 100* 98 110*  BUN 21 14 17   CREATININE 0.75 0.71 0.72  CALCIUM 9.3 8.9 9.4  MG  --   --  1.6*   Liver Function Tests: Recent Labs  Lab 03/31/21 0154 04/02/21 0410  AST 35 31  ALT 26 25  ALKPHOS 136* 117  BILITOT 0.7 0.6  PROT 8.2* 7.8  ALBUMIN 3.9 3.5   No results for input(s): LIPASE, AMYLASE in the last 168 hours. No results for input(s): AMMONIA in the last 168 hours. CBC: Recent Labs  Lab 03/31/21 0154 04/01/21 0535 04/02/21 0410  WBC 11.3* 11.5* 9.4  NEUTROABS 8.1*  --  4.8  HGB 9.7* 8.9* 9.3*  HCT 29.4* 28.4* 29.0*  MCV 76.6* 77.2* 76.5*  PLT 209 168 178   Cardiac Enzymes: No results for input(s): CKTOTAL, CKMB, CKMBINDEX, TROPONINI in the last 168 hours. BNP: BNP (last 3 results) No results for input(s): BNP in the last 8760 hours.  ProBNP (last 3 results) No results for input(s): PROBNP in the last 8760 hours.  CBG: No results for input(s): GLUCAP in the last 168 hours.     Signed:  Florencia Reasons MD, PhD, FACP  [facility] 04/02/2021, 1:57 PM

## 2021-04-07 LAB — CULTURE, BLOOD (ROUTINE X 2)
Culture: NO GROWTH
Culture: NO GROWTH
Special Requests: ADEQUATE
Special Requests: ADEQUATE

## 2021-05-01 ENCOUNTER — Non-Acute Institutional Stay: Payer: Medicare Other | Admitting: Nurse Practitioner

## 2021-05-01 ENCOUNTER — Encounter: Payer: Self-pay | Admitting: Nurse Practitioner

## 2021-05-01 ENCOUNTER — Other Ambulatory Visit: Payer: Self-pay

## 2021-05-01 VITALS — BP 137/69 | HR 82 | Temp 97.4°F | Resp 18 | Wt 187.1 lb

## 2021-05-01 DIAGNOSIS — R5381 Other malaise: Secondary | ICD-10-CM

## 2021-05-01 DIAGNOSIS — Z515 Encounter for palliative care: Secondary | ICD-10-CM

## 2021-05-01 DIAGNOSIS — Z716 Tobacco abuse counseling: Secondary | ICD-10-CM

## 2021-05-01 NOTE — Progress Notes (Signed)
[place] Consult Note Telephone: [phone]  Fax: [phone]    Date of encounter: 05/01/21 7:08 PM PATIENT NAME: Ryan Dunlap  [id number]   [phone] (home)  DOB: 1956-05-05 MRN: [id number] PRIMARY CARE PROVIDER:    Dr Lucianne Lei [facility]  RESPONSIBLE PARTY:    Contact Information     Name Relation Home Work Mobile   Ryan Dunlap   [phone]      I met face to face with patient in facility. Palliative Care was asked to follow this patient by consultation request of  Dr Nona Dell to address advance care planning and complex medical decision making. This is a follow up visit.                                  ASSESSMENT AND PLAN / RECOMMENDATIONS:  Symptom Management/Plan: ACP: full code with wishes full scope interventions; pending discussions with brother 2. Debility secondary to COPD, dysphagia, continue to encourage mobility, strengthening, self independence, continues to improve; ambulating with therapy;gait belt 3. Tobacco cessation declined 4. Palliative care encounter; Palliative care encounter; Palliative medicine team will continue to support patient, patient's family, and medical team. Visit consisted of counseling and education dealing with the complex and emotionally intense issues of symptom management and palliative care in the setting of serious and potentially life-threatening illness   11/09/2020 weight 144 lbs 12/02/2020 weight 149 lbs 01/13/2021 weight 155.6 lbs  04/07/2021 weight 187.1 lbs  Follow up Palliative Care Visit: Palliative care will continue to follow for complex medical decision making, advance care planning, and clarification of goals. Return 4 weeks or prn.  I spent 37 minutes providing this consultation starting at 1:00pm. More than 50% of the time in this consultation was spent in counseling and care  coordination. PPS: 40%  Chief Complaint: Follow up palliative consult for complex medical decision making  HISTORY OF PRESENT ILLNESS:  Ryan Dunlap is a 65 y.o. year old male  with  multiple medical problems to include COPD, dysphasia, hepatic stenosis, hypertension, hyperlipidemia, substance abuse including alcohol, cocaine abuse, Protein calorie malnutrition. 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 Ryan Dunlap was discharged to [facility] [place] facility where he currently resides. He does transfer to the wheelchair where he is mobile. Ryan Dunlap has been ambulating with therapy and gait belt. Ryan Dunlap does require some assistance with adls bathing, dressing. Ryan. Dunlap does feed himself with fair appetite. Ryan Dunlap is on a heart healthy diet regular texture regular liquid consistency with current weight, No new changes per staff. No recent falls, wounds, infections, hospitalizations per staff. Ryan Dunlap is a full code. Ryan Dunlap does go out to smoke on the smoking patio. At present Ryan Dunlap is sitting on the side of the bed in his room. Ryan Dunlap appears comfortable. I visited and observed Ryan Dunlap. We talked about purpose of PC visit, Ryan Dunlap in agreement. We talked about how Ryan Dunlap has been feeling. Ryan. Age endorses he has been doing better. No pain he is comfortable. Ryan Dunlap mobility has increased as he is ambulating with gait belt with therapy. Praised Ryan Dunlap for his efforts. We talked about his appetite which has been improving with weight gain. We talked about transition to LTC. We talked about quality of  life. Ryan Dunlap declined smoking cessation. We talked about residing at [place]. We talked about role pc in poc. Medical goals reviewed. Emotional support provided. I updated staff, no new orders.   History obtained from review of EMR, discussion with facility staff and Ryan.  Carroll.  I reviewed available labs, medications, imaging, studies and related documents from the EMR.  Records reviewed and summarized above.   ROS Full 14 system review of systems performed and negative with exception of: as per HPI.   Physical Exam: Constitutional: NAD General: pleasant male EYES: lids intact ENMT: oral mucous membranes moist CV: S1S2, RRR, Pulmonary: LCTA, no increased work of breathing, no cough, room air Abdomen: normo-active BS + 4 quadrants, soft and non tender MSK: w/c; ambulatory with staff: gait belt Skin: warm and dry Neuro:  + generalized weakness,  + cognitive impairment Psych: non-anxious affect, A and O x 3  Questions and concerns were addressed. Provided general support and encouragement, no other unmet needs identified   [name] Llorente.  The palliative care team will continue to follow. Please call our office at [phone] if we can be of additional assistance.   This chart was dictated using voice recognition software.  Despite best efforts to proofread,  errors can occur which can change the documentation meaning.   Raidyn Breiner Ihor Gully, NP

## 2021-07-02 DIAGNOSIS — S96919A Strain of unspecified muscle and tendon at ankle and foot level, unspecified foot, initial encounter: Secondary | ICD-10-CM

## 2021-07-02 HISTORY — DX: Strain of unspecified muscle and tendon at ankle and foot level, unspecified foot, initial encounter: S96.919A

## 2021-07-08 DIAGNOSIS — M79672 Pain in left foot: Secondary | ICD-10-CM

## 2021-07-08 DIAGNOSIS — S96919A Strain of unspecified muscle and tendon at ankle and foot level, unspecified foot, initial encounter: Secondary | ICD-10-CM | POA: Insufficient documentation

## 2021-07-08 HISTORY — DX: Strain of unspecified muscle and tendon at ankle and foot level, unspecified foot, initial encounter: S96.919A

## 2021-07-08 HISTORY — DX: Pain in left foot: M79.672

## 2021-07-15 ENCOUNTER — Other Ambulatory Visit: Payer: Self-pay

## 2021-07-15 ENCOUNTER — Emergency Department
Admission: EM | Admit: 2021-07-15 | Discharge: 2021-07-15 | Disposition: A | Discharge status: Home / Self Care | Payer: Medicare Other | Attending: Emergency Medicine | Admitting: Emergency Medicine

## 2021-07-15 ENCOUNTER — Emergency Department: Payer: Medicare Other

## 2021-07-15 DIAGNOSIS — Z79899 Other long term (current) drug therapy: Secondary | ICD-10-CM | POA: Insufficient documentation

## 2021-07-15 DIAGNOSIS — J449 Chronic obstructive pulmonary disease, unspecified: Secondary | ICD-10-CM | POA: Diagnosis not present

## 2021-07-15 DIAGNOSIS — I10 Essential (primary) hypertension: Secondary | ICD-10-CM | POA: Diagnosis not present

## 2021-07-15 DIAGNOSIS — M792 Neuralgia and neuritis, unspecified: Secondary | ICD-10-CM

## 2021-07-15 DIAGNOSIS — M79672 Pain in left foot: Secondary | ICD-10-CM | POA: Insufficient documentation

## 2021-07-15 MED ORDER — LIDOCAINE 5 % EX PTCH: 1.0000 | MEDICATED_PATCH | Freq: Two times a day (BID) | CUTANEOUS | 0 refills | Status: DC

## 2021-07-15 MED ORDER — LORAZEPAM 2 MG/ML IJ SOLN
1.0000 mg | Freq: Once | INTRAMUSCULAR | Status: AC
  Administered 2021-07-15: 16:00:00 1 mg via INTRAVENOUS
  Filled 2021-07-15: qty 1

## 2021-07-15 MED ORDER — GABAPENTIN 300 MG PO CAPS
300.0000 mg | ORAL_CAPSULE | Freq: Once | ORAL | Status: AC
  Administered 2021-07-15: 16:00:00 300 mg via ORAL
  Filled 2021-07-15: qty 1

## 2021-07-15 MED ORDER — LORAZEPAM 2 MG/ML IJ SOLN
1.0000 mg | Freq: Once | INTRAMUSCULAR | Status: AC
  Administered 2021-07-15: 16:00:00 1 mg via INTRAMUSCULAR
  Filled 2021-07-15: qty 1

## 2021-07-15 MED ORDER — GABAPENTIN 300 MG PO CAPS: 300.0000 mg | ORAL_CAPSULE | Freq: Three times a day (TID) | ORAL | 2 refills | Status: DC

## 2021-07-15 MED ORDER — LIDOCAINE 5 % EX PTCH
1.0000 | MEDICATED_PATCH | CUTANEOUS | Status: DC
  Administered 2021-07-15: 16:00:00 1 via TRANSDERMAL
  Filled 2021-07-15: qty 1

## 2021-07-15 NOTE — ED Notes (Signed)
[facility] called for transport back to [facility]

## 2021-07-15 NOTE — ED Notes (Signed)
Patient refused vitals.

## 2021-07-15 NOTE — ED Notes (Signed)
Pt. Refuses to wear and oxygen or cardiac monitoring stating he is in too much pain.

## 2021-07-15 NOTE — ED Triage Notes (Signed)
Pt comes into the ED via EMS from [facility] health care. Left leg pain, Fx a month ago, 600mg  gabapentin PTA with no relief. Pt has a othro boot on arrival  133/82 HR90 100%RA

## 2021-07-15 NOTE — ED Notes (Addendum)
This RN called [facility] at [phone], line was busy. RN called [phone]; report given to [facility], RN.

## 2021-07-15 NOTE — ED Provider Notes (Addendum)
[facility] Emergency Department Provider Note   ____________________________________________   Event Date/Time   First MD Initiated Contact with Patient 07/15/21 1441     (approximate)  I have reviewed the triage vital signs and the nursing notes.   HISTORY  Chief Complaint Leg Pain    HPI Ryan Dunlap is a 65 y.o. male patient reported he had burning and tingling making his whole body jump.  The burning and tingling was localized to the left foot instep.  He has not had it before it started last night.  It was quite severe.  It is currently gone.  He got gabapentin 600 mg prior to leaving the care facility.  He said the gabapentin made him sleepy.  He did not seem to do much else with the sensation is now gone.  He tells me his not had it before.  He reportedly broke his left leg a month ago.  He comes in with an Ortho boot on arrival.  He also has a compression stocking up to his knee and was wrapped with a Kling dressing.  He reports this was put on in an effort to stop the burning and tingling.        Past Medical History:  Diagnosis Date   Cirrhosis, alcoholic (HCC)    COPD (chronic obstructive pulmonary disease) (HCC)    Encephalopathy    Hypertension     Patient Active Problem List   Diagnosis Date Noted   SIRS (systemic inflammatory response syndrome) (HCC) 03/31/2021    History reviewed. No pertinent surgical history.  Prior to Admission medications   Medication Sig Start Date End Date Taking? Authorizing Provider  gabapentin (NEURONTIN) 300 MG capsule Take 1 capsule (300 mg total) by mouth 3 (three) times daily. 07/15/21 07/15/22 Yes Arnaldo Natal, MD  lidocaine (LIDODERM) 5 % Place 1 patch onto the skin every 12 (twelve) hours. Remove & Discard patch within 12 hours or as directed by MD 07/15/21 07/15/22 Yes Arnaldo Natal, MD  acetaminophen (TYLENOL) 500 MG tablet Take 1,000 mg by mouth every 8 (eight) hours as needed.    [provider]  amLODipine (NORVASC) 10 MG tablet Take 10 mg by mouth daily.    [provider]  bisacodyl (FLEET) 10 MG/30ML ENEM Place 10 mg rectally once.    [provider]  diclofenac Sodium (VOLTAREN) 1 % GEL Apply 2 g topically 4 (four) times daily.    [provider]  Ergocalciferol (VITAMIN D2) 10 MCG (400 UNIT) TABS Take 1 tablet by mouth daily.    [provider]  folic acid (FOLVITE) 1 MG tablet Take 1 mg by mouth daily.    [provider]  Multiple Vitamin (MULTIVITAMIN WITH MINERALS) TABS tablet Take 1 tablet by mouth daily.    [provider]  omeprazole (PRILOSEC) 20 MG capsule Take 20 mg by mouth daily.    [provider]  thiamine 100 MG tablet Take 100 mg by mouth daily.    [provider]  Tiotropium Bromide Monohydrate 2.5 MCG/ACT AERS Inhale 1 puff into the lungs daily.    [provider]  traMADol (ULTRAM) 50 MG tablet Take 25 mg by mouth every 6 (six) hours as needed.    [provider]    Allergies Ibuprofen, Lisinopril, and Losartan  No family history on file.  Social History Social History   Tobacco Use   Smoking status: Unknown  Substance Use Topics   Alcohol use:  Not Currently   Drug use: Not Currently    Review of Systems  Constitutional: No fever/chills Eyes: No visual changes. ENT: No sore throat. Cardiovascular: Denies chest pain. Respiratory: Denies shortness of breath. Gastrointestinal: No abdominal pain.  No nausea, no vomiting.  No diarrhea.  No constipation. Genitourinary: Negative for dysuria. Musculoskeletal: Negative for back pain. Skin: Negative for rash. Neurological: Negative for headaches, focal weakness or numbness.  ____________________________________________   PHYSICAL EXAM:  VITAL SIGNS: ED Triage Vitals [07/15/21 1350]  Enc Vitals Group     BP 137/87     Pulse Rate 89     Resp 20     Temp      Temp src      SpO2 96 %      Weight      Height      Head Circumference      Peak Flow      Pain Score      Pain Loc      Pain Edu?      Excl. in GC?     Constitutional: Alert and oriented. Well appearing and in no acute distress. Eyes: Conjunctivae are normal. . Head: Atraumatic. Nose: No congestion/rhinnorhea. Mouth/Throat: Mucous membranes are moist.  Oropharynx non-erythematous. Neck: No stridor.  Cardiovascular: Normal rate, regular rhythm. Grossly normal heart sounds.  Good peripheral circulation. Respiratory: Normal respiratory effort.  No retractions. Lungs CTAB. Gastrointestinal: Soft and nontender. No distention. No abdominal bruits.  Musculoskeletal: Patient reports his leg feels back to normal now.  The orthopedic boot was removed.  The Kling dressing was cut off.  The compression stocking was taken down.  Skin looks normal there is no sign of any skin breakdown.  There is no rash.  There is no swelling.  Touching it and pushing on the area is not painful or tender and does not reproduce any symptoms. Neurologic:  Normal speech and language. No gross focal neurologic deficits are appreciated. Skin:  Skin is warm, dry and intact. No rash noted. Psychiatric: Mood and affect are normal. Speech and behavior are normal.  ____________________________________________   LABS (all labs ordered are listed, but only abnormal results are displayed)  Labs Reviewed - No data to display ____________________________________________  EKG   ____________________________________________  RADIOLOGY Jill Poling, personally viewed and evaluated these images (plain radiographs) as part of my medical decision making, as well as reviewing the written report by the radiologist.  ED MD interpretation:    Official radiology report(s): DG Foot Complete Left  Result Date: 07/15/2021 CLINICAL DATA:  Sudden onset of pain and burning.  No injury. EXAM: LEFT FOOT - COMPLETE 3+ VIEW COMPARISON:  None. FINDINGS:  Hallux valgus. Osteopenia. No acute osseous or joint abnormality. Mild degenerative changes in the midfoot. IMPRESSION: 1. No acute findings. 2. Mild midfoot degenerative changes. Electronically Signed   By: Leanna Battles M.D.   On: 07/15/2021 15:58    ____________________________________________   PROCEDURES  Procedure(s) performed (including Critical Care):  Procedures   ____________________________________________   INITIAL IMPRESSION / ASSESSMENT AND PLAN / ED COURSE  ----------------------------------------- 3:06 PM on 07/15/2021 ----------------------------------------- I am uncertain what was causing the patient's burning and tingling and making his whole body jump.  He said the symptoms were localized to that 1 area of his instep but the pain was so intense that again his body was jumping.  They seem to have stopped after the gabapentin.  I will try some gabapentin 3 times daily for her  bit and have his regular doctor recheck him.  We will replace the Kling dressing to make sure the orthopedic boot is well-padded.  He should probably follow-up with orthopedics as well and see if he can have the boot off.    ----------------------------------------- 4:09 PM on 07/15/2021 ----------------------------------------- Patient's pain has come back.  He is yelling and screaming and thrashing around on the bed.  The pain is in the instep where it was previously.  Touching the area makes it worse.  There are no skin changes now either.  X-ray is negative.  I discussed him with neurology who thought we could get an MRI of his foot but really did not have anything else to offer. I then discussed him with podiatry.  Podiatry thinks that since the pain is intermittent and not related to weightbearing MRI would not be useful.  He does not have anything else to offer besides gabapentin.  I will give the gentleman some more Ativan he already had 1 mg IM.  I will give him a milligram IV to see if we  can calm down and then try a Lidoderm patch on his foot.  Hopefully this will calm down the pain until such time as the gabapentin can kick in.  ----------------------------------------- 4:31 PM on 07/15/2021 ----------------------------------------- Seems to be doing better now.  We will wait for a little bit and if he continues to tolerate things will leave him with the lidocaine patch on.  I have written him for a prescription for some more every 12 hours.  Hopefully this will take care of the pain until such time as the gabapentin kicks in.  He can return at any time.  Follow-up with his doctor.  Podiatry can follow-up with him.     ____________________________________________   FINAL CLINICAL IMPRESSION(S) / ED DIAGNOSES  Final diagnoses:  Neuropathic pain of left foot     ED Discharge Orders          Ordered    gabapentin (NEURONTIN) 300 MG capsule  3 times daily        07/15/21 1508    lidocaine (LIDODERM) 5 %  Every 12 hours        07/15/21 1628             Note:  This document was prepared using Dragon voice recognition software and may include unintentional dictation errors.    Arnaldo Natal, MD 07/15/21 1508 ----------------------------------------- 3:32 PM on 07/15/2021 ----------------------------------------- Patient ready for discharge when all of a sudden pain comes back.  Is reproducible by touching the small area in his left foot instep.  It is driving him crazy and he is jumping and jerking all around and yelling and screaming.  There are no changes in the skin or foot that is warm and dry skin looks normal.  Appears to be some kind of neuropathy.  Patient did not put weight on it when it started.   Arnaldo Natal, MD 07/15/21 [phone]

## 2021-07-15 NOTE — ED Notes (Signed)
This RN called [facility] at [phone] & was transferred to [phone], but no answer.

## 2021-07-15 NOTE — ED Notes (Signed)
This RN went to assess pt. Pt is asleep & did not wake up for this RN to assess.

## 2021-07-15 NOTE — Discharge Instructions (Addendum)
Patient's symptoms have resolved.  Not sure if this is due to the gabapentin but it could be.  I will prescribe gabapentin 303 times a day for couple weeks.  Please have his regular doctor check on him and see how he is doing.    He can have Lidoderm patch applied to the instep of the left foot where the pain is 2 times a day if needed to control the pain.  Dr. Morene Rankins, the podiatrist at [facility] will follow-up with you.  The phone number is [phone].  The address is [address] in [place].  Call the office let them know that you were in the emergency department and the emergency doctor talked to Dr. Lilian Kapur and Dr. Lilian Kapur said he would follow you up in the office.  They should make you an appointment in a week or 2.  This will give the medication time to work.  Please return for any worsening pain or breakthrough pain that she cannot stand or any changes in the skin of the foot.

## 2021-07-27 ENCOUNTER — Encounter: Payer: Self-pay | Admitting: *Deleted

## 2021-07-27 ENCOUNTER — Other Ambulatory Visit: Payer: Self-pay

## 2021-07-27 DIAGNOSIS — J449 Chronic obstructive pulmonary disease, unspecified: Secondary | ICD-10-CM | POA: Diagnosis not present

## 2021-07-27 DIAGNOSIS — M792 Neuralgia and neuritis, unspecified: Secondary | ICD-10-CM | POA: Diagnosis not present

## 2021-07-27 DIAGNOSIS — Z7951 Long term (current) use of inhaled steroids: Secondary | ICD-10-CM | POA: Insufficient documentation

## 2021-07-27 DIAGNOSIS — Z79899 Other long term (current) drug therapy: Secondary | ICD-10-CM | POA: Diagnosis not present

## 2021-07-27 DIAGNOSIS — I10 Essential (primary) hypertension: Secondary | ICD-10-CM | POA: Diagnosis not present

## 2021-07-27 DIAGNOSIS — M79671 Pain in right foot: Secondary | ICD-10-CM | POA: Diagnosis present

## 2021-07-27 NOTE — ED Triage Notes (Addendum)
Pt to ED via EMS from [facility] with bilateral foot pain. Pt was given tramadol and lidocaine patches that have not helped. Pt  has hx of peripheral neuropathy. Pedal pulses intact. No known injury. PT reports he has been taking gabapentin without relief.   EMS vitals:  93 NSR  96% RA 157/91 98.7 F CBG 123

## 2021-07-28 ENCOUNTER — Emergency Department
Admission: EM | Admit: 2021-07-28 | Discharge: 2021-07-28 | Disposition: A | Discharge status: Home / Self Care | Payer: Medicare Other | Attending: Student in an Organized Health Care Education/Training Program | Admitting: Student in an Organized Health Care Education/Training Program

## 2021-07-28 DIAGNOSIS — M792 Neuralgia and neuritis, unspecified: Secondary | ICD-10-CM | POA: Diagnosis not present

## 2021-07-28 DIAGNOSIS — G5793 Unspecified mononeuropathy of bilateral lower limbs: Secondary | ICD-10-CM

## 2021-07-28 MED ORDER — HYDROXYZINE HCL 25 MG PO TABS
25.0000 mg | ORAL_TABLET | Freq: Once | ORAL | Status: AC
  Administered 2021-07-28: 08:00:00 25 mg via ORAL
  Filled 2021-07-28: qty 1

## 2021-07-28 MED ORDER — LORAZEPAM 2 MG/ML IJ SOLN
1.0000 mg | Freq: Once | INTRAMUSCULAR | Status: AC
  Administered 2021-07-28: 09:00:00 1 mg via INTRAMUSCULAR
  Filled 2021-07-28: qty 1

## 2021-07-28 NOTE — Discharge Instructions (Signed)
Call make an appointment with his primary care provider for further evaluation of his chronic neuropathy.  Patient is to continue with his regular medications.  Continue his gabapentin 3 times a day unless instructed by his primary care provider.

## 2021-07-28 NOTE — ED Notes (Signed)
Pt to be transported back to [facility] via [facility] [facility] [employer] [facility].

## 2021-07-28 NOTE — ED Provider Notes (Signed)
[facility] Emergency Department Provider Note  ____________________________________________   Event Date/Time   First MD Initiated Contact with Patient 07/28/21 [phone]     (approximate)  I have reviewed the triage vital signs and the nursing notes.   HISTORY  Chief Complaint Foot Pain   HPI Ryan Dunlap is a 65 y.o. male is brought to the ED via EMS from [place] [facility] facility.  Patient states that his feet are itching.  Patient states that he was given medication that is not helping and that the itching is "tearing up his nerves".  He was seen in the ED on 07/15/2021 for similar symptoms.  Patient was to follow-up with podiatry for his feet but has not at this time.  He denies any recent injury to his foot.  Currently rates pain as 10/10.         Past Medical History:  Diagnosis Date   Cirrhosis, alcoholic (HCC)    COPD (chronic obstructive pulmonary disease) (HCC)    Encephalopathy    Hypertension     Patient Active Problem List   Diagnosis Date Noted   SIRS (systemic inflammatory response syndrome) (HCC) 03/31/2021    History reviewed. No pertinent surgical history.  Prior to Admission medications   Medication Sig Start Date End Date Taking? Authorizing Provider  acetaminophen (TYLENOL) 500 MG tablet Take 1,000 mg by mouth every 8 (eight) hours as needed.    [provider]  amLODipine (NORVASC) 10 MG tablet Take 10 mg by mouth daily.    [provider]  bisacodyl (FLEET) 10 MG/30ML ENEM Place 10 mg rectally once.    [provider]  diclofenac Sodium (VOLTAREN) 1 % GEL Apply 2 g topically 4 (four) times daily.    [provider]  Ergocalciferol (VITAMIN D2) 10 MCG (400 UNIT) TABS Take 1 tablet by mouth daily.    [provider]  folic acid (FOLVITE) 1 MG tablet Take 1 mg by mouth daily.    [provider]  gabapentin (NEURONTIN) 300 MG capsule Take 1 capsule (300 mg total) by mouth 3  (three) times daily. 07/15/21 07/15/22  Arnaldo Natal, MD  lidocaine (LIDODERM) 5 % Place 1 patch onto the skin every 12 (twelve) hours. Remove & Discard patch within 12 hours or as directed by MD 07/15/21 07/15/22  Arnaldo Natal, MD  Multiple Vitamin (MULTIVITAMIN WITH MINERALS) TABS tablet Take 1 tablet by mouth daily.    [provider]  omeprazole (PRILOSEC) 20 MG capsule Take 20 mg by mouth daily.    [provider]  thiamine 100 MG tablet Take 100 mg by mouth daily.    [provider]  Tiotropium Bromide Monohydrate 2.5 MCG/ACT AERS Inhale 1 puff into the lungs daily.    [provider]  traMADol (ULTRAM) 50 MG tablet Take 25 mg by mouth every 6 (six) hours as needed.    [provider]    Allergies Ibuprofen, Lisinopril, and Losartan  History reviewed. No pertinent family history.  Social History Social History   Tobacco Use   Smoking status: Unknown  Substance Use Topics   Alcohol use: Not Currently   Drug use: Not Currently    Review of Systems Constitutional: No fever/chills Eyes: No visual changes. ENT: No sore throat. Cardiovascular: Denies chest pain. Respiratory: Denies shortness of breath. Gastrointestinal: No abdominal pain.  No nausea, no vomiting.  No diarrhea.  No constipation. Musculoskeletal: Bilateral feet pain. Skin: Negative for rash. Neurological: Negative for  headaches, focal weakness or numbness.   ____________________________________________   PHYSICAL EXAM:  VITAL SIGNS: ED Triage Vitals  Enc Vitals Group     BP 07/27/21 2319 (!) 144/86     Pulse Rate 07/27/21 2319 78     Resp 07/27/21 2319 16     Temp 07/27/21 2319 98.4 F (36.9 C)     Temp src --      SpO2 07/27/21 2319 98 %     Weight 07/27/21 2314 187 lb 2.7 oz (84.9 kg)     Height 07/27/21 2314 6\' 3"  (1.905 m)     Head Circumference --      Peak Flow --      Pain Score 07/27/21 2320 10     Pain Loc --      Pain Edu? --       Excl. in GC? --     Constitutional: Alert and oriented. Well appearing and in no acute distress.  Patient is hollering and screaming loudly that his feet are hurting especially on the right foot fourth and fifth digits. Eyes: Conjunctivae are normal.  Head: Atraumatic. Neck: No stridor.   Cardiovascular: Normal rate, regular rhythm. Grossly normal heart sounds.  Good peripheral circulation. Respiratory: Normal respiratory effort.  No retractions. Lungs CTAB. Musculoskeletal: Examination bilateral feet there is no erythema, edema or evidence of injury.  Webspaces were evaluated and no blisters or ulcers were noted.  No skin rashes present.  Pulses are present bilaterally both DP and TP.  Patient is constantly kicking his feet making it difficult to evaluate.  Patient was told that he needed to be still long enough for the evaluation.  In looking over his records his behavior was the same when he was seen at the first of the month. Neurologic:  Normal speech and language. No gross focal neurologic deficits are appreciated.  Skin:  Skin is warm, dry and intact. No rash noted. Psychiatric: Mood and affect are normal. Speech and behavior are normal.  ____________________________________________   LABS (all labs ordered are listed, but only abnormal results are displayed)  Labs Reviewed - No data to display ____________________________________________  PROCEDURES  Procedure(s) performed (including Critical Care):  Procedures   ____________________________________________   INITIAL IMPRESSION / ASSESSMENT AND PLAN / ED COURSE  As part of my medical decision making, I reviewed the following data within the electronic MEDICAL RECORD NUMBER Notes from prior ED visits and [place] Controlled Substance Database     65 year old male is brought to the ED via EMS from [facility].  Patient has a history of neuropathy and is currently taking gabapentin 300 mg 3 times daily.  Patient denied any  recent injury.  In reviewing his prior chart for the same symptoms the first of this month patient's behavior was the same as today with patient screaming and hollering and also kicking at staff members.  Patient was given Vistaril 25 mg p.o. and 1 mg Ativan IM.  He reports that this is helped with his pain greatly.  He has continued taking gabapentin 3 times daily until he sees his PCP.  On his last visit he was given a prescription for Lidoderm patches and is to continue those as well.   ____________________________________________   FINAL CLINICAL IMPRESSION(S) / ED DIAGNOSES  Final diagnoses:  Neuropathic pain of both feet     ED Discharge Orders     None        Note:  This document was prepared using Dragon voice recognition  software and may include unintentional dictation errors.    Tommi Rumps, PA-C 07/28/21 1041    Willy Eddy, MD 07/28/21 1052

## 2021-07-28 NOTE — ED Notes (Signed)
This RN advised pt to stay in the bed. Pt at the foot of the bed putting on shoes c/o his feet being severely itchy. Pt advised medication has been administered and to give it time to work. Pt still screaming and throwing himself all over bed. Pt legally blind.

## 2021-08-14 ENCOUNTER — Non-Acute Institutional Stay: Payer: Medicare Other | Admitting: Nurse Practitioner

## 2021-08-14 ENCOUNTER — Encounter: Payer: Self-pay | Admitting: Nurse Practitioner

## 2021-08-14 VITALS — BP 126/76 | HR 82 | Temp 97.6°F | Resp 18 | Wt 218.8 lb

## 2021-08-14 DIAGNOSIS — Z716 Tobacco abuse counseling: Secondary | ICD-10-CM

## 2021-08-14 DIAGNOSIS — J449 Chronic obstructive pulmonary disease, unspecified: Secondary | ICD-10-CM

## 2021-08-14 DIAGNOSIS — Z515 Encounter for palliative care: Secondary | ICD-10-CM

## 2021-08-14 DIAGNOSIS — R5381 Other malaise: Secondary | ICD-10-CM

## 2021-08-14 NOTE — Progress Notes (Signed)
[facility] Consult Note Telephone: [phone]  Fax: [phone]    Date of encounter: 08/14/21 6:43 PM PATIENT NAME: Ryan Dunlap [place] [address]   [id number] (home)  DOB: 02-24-1956 MRN: [id number] PRIMARY CARE PROVIDER:    [place]:    Contact Information     Name Relation Home Work Mobile   [place]   [phone]      I met face to face with patient in facility. Palliative Care was asked to follow this patient by consultation request of  She[place] to address advance care planning and complex medical decision making. This is a follow up visit.                                  ASSESSMENT AND PLAN / RECOMMENDATIONS:  Symptom Management/Plan: ACP: full code with wishes full scope interventions; pending discussions with brother 2. Debility secondary to COPD, dysphagia, continue to encourage mobility, strengthening, self independence, continues to improve 3. Tobacco cessation declined 4. Palliative care encounter; Palliative care encounter; Palliative medicine team will continue to support patient, patient's family, and medical team. Visit consisted of counseling and education dealing with the complex and emotionally intense issues of symptom management and palliative care in the setting of serious and potentially life-threatening illness  Follow up Palliative Care Visit: Palliative care will continue to follow for complex medical decision making, advance care planning, and clarification of goals. Return 8 weeks or prn.  I spent 39 minutes providing this consultation starting at 11:30 am. More than 50% of the time in this consultation was spent in counseling and care coordination.  PPS: 50%  Chief Complaint: Follow up palliative consult for complex medical decision making  HISTORY OF PRESENT ILLNESS:  Ryan Dunlap  is a 66 y.o. year old male  with multiple medical problems to include COPD, dysphasia, hepatic stenosis, hypertension, hyperlipidemia, substance abuse including alcohol, cocaine abuse, Protein calorie malnutrition. 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 Ryan Dunlap was discharged to [facility] facility where he continues to resides. He does transfer to the wheelchair where he is mobile. Ryan Dunlap does require some assistance with adls bathing, dressing. Ryan Dunlap does feed himself with fair appetite. Ryan Dunlap is on a heart healthy diet regular texture regular liquid consistency with weight gain. No new changes per staff. No recent falls, wounds, infections, hospitalizations per staff. Ryan Dunlap is a full code. Ryan Dunlap does go out to smoke on the smoking patio. Ryan Dunlap had 2 ED visit in 07/2021 for neuropathy then d/c to [facility]. Ryan Dunlap does have a rash left lower back, complains of itching. At present Ryan Dunlap is sitting in his w/c where he propels. Ryan Dunlap complains of itching, notify primary and assessed. Ryan Dunlap was cooperative with assessment. We talked about residing at facility, declined smoking cessation. Discussed self independent as much as possible. Good appetite. Ryan Dunlap does socialize with other residents. Medical goals reviewed. Will continue current poc. Emotional support provided. I have attempted to contact Ryan Dunlap brother for update on pc visit, no new recommendations to poc. I updated staff.   History obtained from review of EMR, discussion with facility staff and r Ryan Dunlap.  I reviewed available labs, medications, imaging, studies and  related documents from the EMR.  Records reviewed and summarized above.   ROS 10 point system reviewed with staff, Ryan. [place] all negative except HPI  Physical Exam: Constitutional: NAD General: Male cooperative,  debilitated EYES: lids intact ENMT:  oral mucous membranes moist CV: S1S2, RRR Pulmonary: LCTA, no increased work of breathing, no cough, room air Abdomen: normo-active BS + 4 quadrants, soft and non tender MSK: w/c dependent Skin: warm and dry, papules left lower back, puritis in clusters with circumference of about 8 inches circular Neuro:  no generalized weakness,  no cognitive impairment Psych: non-anxious affect, A and O x 3 Thank you for the opportunity to participate in the care of Ryan Dunlap.  The palliative care team will continue to follow. Please call our office at [phone] if we can be of additional assistance.   This chart was dictated using voice recognition software.  Despite best efforts to proofread,  errors can occur which can change the documentation meaning.   Questions and concerns were addressed.  Provided general support and encouragement, no other unmet needs identified   Shayla Heming Ihor Gully, NP

## 2021-08-17 ENCOUNTER — Other Ambulatory Visit: Payer: Self-pay

## 2021-09-01 ENCOUNTER — Other Ambulatory Visit: Payer: Self-pay

## 2021-09-01 DIAGNOSIS — Z1211 Encounter for screening for malignant neoplasm of colon: Secondary | ICD-10-CM

## 2021-09-01 NOTE — Progress Notes (Unsigned)
Gastroenterology Pre-Procedure Review  Request Date: 10/09/2021 Requesting Physician: Dr. Tobi Bastos   PATIENT REVIEW QUESTIONS: The patient responded to the following health history questions as indicated:    1. Are you having any GI issues? no 2. Do you have a personal history of Polyps? no 3. Do you have a family history of Colon Cancer or Polyps? no 4. Diabetes Mellitus? no 5. Joint replacements in the past 12 months?no 6. Major health problems in the past 3 months?no 7. Any artificial heart valves, MVP, or defibrillator?no    MEDICATIONS & ALLERGIES:    Patient reports the following regarding taking any anticoagulation/antiplatelet therapy:   Plavix, Coumadin, Eliquis, Xarelto, Lovenox, Pradaxa, Brilinta, or Effient? yes (tylonol 500 ) Aspirin? no  Patient confirms/reports the following medications:  Current Outpatient Medications  Medication Sig Dispense Refill   acetaminophen (TYLENOL) 500 MG tablet Take 1,000 mg by mouth every 8 (eight) hours as needed.     amLODipine (NORVASC) 10 MG tablet Take 10 mg by mouth daily.     bisacodyl (FLEET) 10 MG/30ML ENEM Place 10 mg rectally once.     diclofenac Sodium (VOLTAREN) 1 % GEL Apply 2 g topically 4 (four) times daily.     Ergocalciferol (VITAMIN D2) 10 MCG (400 UNIT) TABS Take 1 tablet by mouth daily.     folic acid (FOLVITE) 1 MG tablet Take 1 mg by mouth daily.     gabapentin (NEURONTIN) 300 MG capsule Take 1 capsule (300 mg total) by mouth 3 (three) times daily. 90 capsule 2   lidocaine (LIDODERM) 5 % Place 1 patch onto the skin every 12 (twelve) hours. Remove & Discard patch within 12 hours or as directed by MD 10 patch 0   Multiple Vitamin (MULTIVITAMIN WITH MINERALS) TABS tablet Take 1 tablet by mouth daily.     omeprazole (PRILOSEC) 20 MG capsule Take 20 mg by mouth daily.     thiamine 100 MG tablet Take 100 mg by mouth daily.     Tiotropium Bromide Monohydrate 2.5 MCG/ACT AERS Inhale 1 puff into the lungs daily.     traMADol  (ULTRAM) 50 MG tablet Take 25 mg by mouth every 6 (six) hours as needed.     No current facility-administered medications for this visit.    Patient confirms/reports the following allergies:  Allergies  Allergen Reactions   Ibuprofen    Lisinopril    Losartan     No orders of the defined types were placed in this encounter.   AUTHORIZATION INFORMATION Primary Insurance: 1D#: Group #:  Secondary Insurance: 1D#: Group #:  SCHEDULE INFORMATION: Date: 10/09/2021 Time: Location:[facility]

## 2021-09-25 DIAGNOSIS — R200 Anesthesia of skin: Secondary | ICD-10-CM | POA: Insufficient documentation

## 2021-09-25 HISTORY — DX: Anesthesia of skin: R20.0

## 2021-10-08 ENCOUNTER — Encounter: Payer: Self-pay | Admitting: Gastroenterology

## 2021-10-09 ENCOUNTER — Ambulatory Visit: Payer: Medicare Other | Admitting: Registered Nurse

## 2021-10-09 ENCOUNTER — Encounter: Payer: Self-pay | Admitting: Gastroenterology

## 2021-10-09 ENCOUNTER — Ambulatory Visit
Admission: RE | Admit: 2021-10-09 | Discharge: 2021-10-09 | Disposition: A | Discharge status: Skilled Nursing Facility | Payer: Medicare Other | Attending: Gastroenterology | Admitting: Gastroenterology

## 2021-10-09 ENCOUNTER — Encounter
Admission: RE | Disposition: A | Discharge status: Skilled Nursing Facility | Payer: Self-pay | Source: Home / Self Care | Attending: Gastroenterology

## 2021-10-09 DIAGNOSIS — I10 Essential (primary) hypertension: Secondary | ICD-10-CM | POA: Insufficient documentation

## 2021-10-09 DIAGNOSIS — K7030 Alcoholic cirrhosis of liver without ascites: Secondary | ICD-10-CM | POA: Insufficient documentation

## 2021-10-09 DIAGNOSIS — G312 Degeneration of nervous system due to alcohol: Secondary | ICD-10-CM | POA: Insufficient documentation

## 2021-10-09 DIAGNOSIS — J449 Chronic obstructive pulmonary disease, unspecified: Secondary | ICD-10-CM | POA: Insufficient documentation

## 2021-10-09 DIAGNOSIS — Z1211 Encounter for screening for malignant neoplasm of colon: Secondary | ICD-10-CM | POA: Diagnosis present

## 2021-10-09 DIAGNOSIS — F17200 Nicotine dependence, unspecified, uncomplicated: Secondary | ICD-10-CM | POA: Insufficient documentation

## 2021-10-09 HISTORY — DX: Alcohol dependence, uncomplicated: F10.20

## 2021-10-09 HISTORY — DX: Polyneuropathy, unspecified: G62.9

## 2021-10-09 HISTORY — DX: Unspecified osteoarthritis, unspecified site: M19.90

## 2021-10-09 HISTORY — PX: COLONOSCOPY WITH PROPOFOL: SHX5780

## 2021-10-09 SURGERY — COLONOSCOPY WITH PROPOFOL
Anesthesia: General

## 2021-10-09 MED ORDER — LIDOCAINE HCL (PF) 2 % IJ SOLN
INTRAMUSCULAR | Status: AC
  Filled 2021-10-09: qty 5

## 2021-10-09 MED ORDER — LIDOCAINE HCL (CARDIAC) PF 100 MG/5ML IV SOSY
PREFILLED_SYRINGE | INTRAVENOUS | Status: DC | PRN
  Administered 2021-10-09: 50 mg via INTRAVENOUS

## 2021-10-09 MED ORDER — PROPOFOL 500 MG/50ML IV EMUL
INTRAVENOUS | Status: AC
  Filled 2021-10-09: qty 50

## 2021-10-09 MED ORDER — PROPOFOL 500 MG/50ML IV EMUL
INTRAVENOUS | Status: DC | PRN
Start: 2021-10-09 — End: 2021-10-09
  Administered 2021-10-09: 125 ug/kg/min via INTRAVENOUS

## 2021-10-09 MED ORDER — PROPOFOL 10 MG/ML IV BOLUS
INTRAVENOUS | Status: DC | PRN
  Administered 2021-10-09: 30 mg via INTRAVENOUS
  Administered 2021-10-09: 60 mg via INTRAVENOUS
  Administered 2021-10-09: 10 mg via INTRAVENOUS

## 2021-10-09 MED ORDER — SODIUM CHLORIDE 0.9 % IV SOLN: INTRAVENOUS | Status: DC

## 2021-10-09 NOTE — H&P (Signed)
? ? ? ?Ryan Bellows, MD ?[address] [place], [place], [place], [address] ?[address], [place], [place], [place], [address] ?Phone: [phone]  ?Fax: [phone] ? ?Primary Care Physician:  Pcp, No ? ? ?Pre-Procedure History & Physical: ?HPI:  Ryan Dunlap is a 66 y.o. male is here for an colonoscopy. ?  ?Past Medical History:  ?Diagnosis Date  ? Alcohol dependence ([place])   ? Arthritis   ? Cirrhosis, alcoholic ([place])   ? COPD (chronic obstructive pulmonary disease) ([place])   ? Encephalopathy   ? Hypertension   ? Polyneuropathy   ? ? ?Past Surgical History:  ?Procedure Laterality Date  ? JOINT REPLACEMENT    ? ? ?Prior to Admission medications   ?Medication Sig Start Date End Date Taking? Authorizing Provider  ?amLODipine (NORVASC) 10 MG tablet Take 10 mg by mouth daily.   Yes [provider]  ?Ergocalciferol (VITAMIN D2) 10 MCG (400 UNIT) TABS Take 1 tablet by mouth daily.   Yes [provider]  ?folic acid (FOLVITE) 1 MG tablet Take 1 mg by mouth daily.   Yes [provider]  ?gabapentin (NEURONTIN) 300 MG capsule Take 1 capsule (300 mg total) by mouth 3 (three) times daily. 07/15/21 07/15/22 Yes Nena Polio, MD  ?Multiple Vitamin (MULTIVITAMIN WITH MINERALS) TABS tablet Take 1 tablet by mouth daily.   Yes [provider]  ?thiamine 100 MG tablet Take 100 mg by mouth daily.   Yes [provider]  ?traMADol (ULTRAM) 50 MG tablet Take 25 mg by mouth every 6 (six) hours as needed.   Yes [provider]  ?diclofenac Sodium (VOLTAREN) 1 % GEL Apply 2 g topically 4 (four) times daily.    [provider]  ?Tiotropium Bromide Monohydrate 2.5 MCG/ACT AERS Inhale 1 puff into the lungs daily.    [provider]  ? ? ?Allergies as of 09/01/2021 - Review Complete 08/14/2021  ?Allergen Reaction Noted  ? Ibuprofen  02/24/2021  ? Lisinopril  02/24/2021  ? Losartan  02/24/2021  ? ? ?History reviewed. No pertinent family history. ? ?Social History   ? ?Socioeconomic History  ? Marital status: Single  ?  Spouse name: Not on file  ? Number of children: Not on file  ? Years of education: Not on file  ? Highest education level: Not on file  ?Occupational History  ? Not on file  ?Tobacco Use  ? Smoking status: Every Day  ?  Types: Cigarettes  ? Smokeless tobacco: Not on file  ?Vaping Use  ? Vaping Use: Never used  ?Substance and Sexual Activity  ? Alcohol use: Not Currently  ? Drug use: Not Currently  ?  Comment: last time used Cocaine 3 years ago  ? Sexual activity: Not on file  ?Other Topics Concern  ? Not on file  ?Social History Narrative  ? Not on file  ? ?Social Determinants of Health  ? ?Financial Resource Strain: Not on file  ?Food Insecurity: Not on file  ?Transportation Needs: Not on file  ?Physical Activity: Not on file  ?Stress: Not on file  ?Social Connections: Not on file  ?Intimate Partner Violence: Not on file  ? ? ?Review of Systems: ?See HPI, otherwise negative ROS ? ?Physical Exam: ?BP (!) 129/116   Pulse 72   Temp (!) 96.4 ?F (35.8 ?C)   Resp 20   Ht 6\' 4"  (1.93 m)   Wt 104.8 kg   SpO2 99%   BMI 28.12 kg/m?  ?General:  Alert,  pleasant and cooperative in NAD ?Head:  Normocephalic and atraumatic. ?Neck:  Supple; no masses or thyromegaly. ?Lungs:  Clear throughout to auscultation, normal respiratory effort.    ?Heart:  +S1, +S2, Regular rate and rhythm, No edema. ?Abdomen:  Soft, nontender and nondistended. Normal bowel sounds, without guarding, and without rebound.   ?Neurologic:  Alert and  oriented x4;  grossly normal neurologically. ? ?Impression/Plan: ?Levorn Elsmore is here for an colonoscopy to be performed for Screening colonoscopy average risk   ?Risks, benefits, limitations, and alternatives regarding  colonoscopy have been reviewed with the patient.  Questions have been answered.  All parties agreeable. ? ? ?Ryan Bellows, MD  10/09/2021, 9:03 AM ? ?

## 2021-10-09 NOTE — Op Note (Signed)
[facility] ?Gastroenterology ?Patient Name: Ryan Dunlap ?Procedure Date: 10/09/2021 9:06 AM ?MRN: [id number] ?Account #: [id number] ?Date of Birth: 1956/04/06 ?Admit Type: Outpatient ?Age: 66 ?Room: [facility] ENDO ROOM 4 ?Gender: Male ?Note Status: Finalized ?Instrument Name: Colonoscope [id number] ?Procedure:             Colonoscopy ?Indications:           Screening for colorectal malignant neoplasm ?Providers:             Wyline Mood MD, MD ?Referring MD:          Sallye Lat Md, MD (Referring MD) ?Medicines:             Monitored Anesthesia Care ?Complications:         No immediate complications. ?Procedure:             Pre-Anesthesia Assessment: ?                       - Prior to the procedure, a History and Physical was  ?                       performed, and patient medications, allergies and  ?                       sensitivities were reviewed. The patient's tolerance  ?                       of previous anesthesia was reviewed. ?                       - The risks and benefits of the procedure and the  ?                       sedation options and risks were discussed with the  ?                       patient. All questions were answered and informed  ?                       consent was obtained. ?                       - ASA Grade Assessment: II - A patient with mild  ?                       systemic disease. ?                       After obtaining informed consent, the colonoscope was  ?                       passed under direct vision. Throughout the procedure,  ?                       the patient's blood pressure, pulse, and oxygen  ?                       saturations were monitored continuously. The  ?                       Colonoscope was  introduced through the anus and  ?                       advanced to the the cecum, identified by the  ?                       appendiceal orifice. The colonoscopy was performed  ?                       with ease. The patient tolerated the procedure well.  ?                        The quality of the bowel preparation was good. ?Findings: ?     The perianal and digital rectal examinations were normal. ?     The entire examined colon appeared normal on direct and retroflexion  ?     views. ?Impression:            - The entire examined colon is normal on direct and  ?                       retroflexion views. ?                       - No specimens collected. ?Recommendation:        - Discharge patient to home (with escort). ?                       - Resume previous diet. ?                       - Continue present medications. ?                       - Repeat colonoscopy in 10 years for screening  ?                       purposes. ?Procedure Code(s):     --- Professional --- ?                       [id number], Colonoscopy, flexible; diagnostic, including  ?                       collection of specimen(s) by brushing or washing, when  ?                       performed (separate procedure) ?Diagnosis Code(s):     --- Professional --- ?                       Z12.11, Encounter for screening for malignant neoplasm  ?                       of colon ?CPT copyright 2019 American Medical Association. All rights reserved. ?The codes documented in this report are preliminary and upon coder review may  ?be revised to meet current compliance requirements. ?Wyline MoodKiran Abdur Hoglund, MD ?Wyline MoodKiran Shady Bradish MD, MD ?10/09/2021 9:34:01 AM ?This report has been signed electronically. ?Number of Addenda: 0 ?Note Initiated On: 10/09/2021 9:06 AM ?Scope Withdrawal Time: 0 hours 8 minutes 16 seconds  ?Total Procedure Duration: 0 hours  12 minutes 17 seconds  ?Estimated Blood Loss:  Estimated blood loss: none. ?     [facility] ?

## 2021-10-09 NOTE — Anesthesia Postprocedure Evaluation (Signed)
Anesthesia Post Note ? ?Patient: Ryan Dunlap ? ?Procedure(s) Performed: COLONOSCOPY WITH PROPOFOL ? ?Patient location during evaluation: PACU ?Anesthesia Type: General ?Level of consciousness: sedated ?Pain management: satisfactory to patient ?Vital Signs Assessment: post-procedure vital signs reviewed and stable ?Respiratory status: spontaneous breathing and respiratory function stable ?Cardiovascular status: stable ?Anesthetic complications: no ? ? ?No notable events documented. ? ? ?Last Vitals:  ?Vitals:  ? 10/09/21 0937 10/09/21 0942  ?BP: 100/61   ?Pulse: 69 74  ?Resp: 18 15  ?Temp:    ?SpO2: 97% 99%  ?  ?Last Pain:  ?Vitals:  ? 10/09/21 0937  ?PainSc: 0-No pain  ? ? ?  ?  ?  ?  ?  ?  ? ?VAN STAVEREN,Rosha Cocker ? ? ? ? ?

## 2021-10-09 NOTE — Transfer of Care (Signed)
Immediate Anesthesia Transfer of Care Note  Patient: Ryan Dunlap  Procedure(s) Performed: COLONOSCOPY WITH PROPOFOL  Patient Location: PACU  Anesthesia Type:General  Level of Consciousness: awake and drowsy  Airway & Oxygen Therapy: Patient Spontanous Breathing  Post-op Assessment: Report given to RN and Post -op Vital signs reviewed and stable  Post vital signs: Reviewed and stable  Last Vitals:  Vitals Value Taken Time  BP 100/61 10/09/21 0937  Temp    Pulse 74 10/09/21 0942  Resp 15 10/09/21 0942  SpO2 99 % 10/09/21 0942  Vitals shown include unvalidated device data.  Last Pain:  Vitals:   10/09/21 0937  PainSc: 0-No pain         Complications: No notable events documented.

## 2021-10-09 NOTE — Anesthesia Preprocedure Evaluation (Addendum)
Anesthesia Evaluation  ?Patient identified by MRN, date of birth, ID band ?Patient awake ? ? ? ?Reviewed: ?Allergy & Precautions, NPO status , Patient's Chart, lab work & pertinent test results ? ?Airway ?Mallampati: II ? ?TM Distance: >3 FB ?Neck ROM: Full ? ? ? Dental ? ?(+) Partial Upper, Poor Dentition ?  ?Pulmonary ?neg pulmonary ROS, COPD, Current Smoker and Patient abstained from smoking.,  ?  ?Pulmonary exam normal ? ?+ decreased breath sounds ? ? ? ? ? Cardiovascular ?Exercise Tolerance: Poor ?hypertension, Pt. on medications ?negative cardio ROS ?Normal cardiovascular exam ?Rhythm:Regular  ? ?  ?Neuro/Psych ?Alcoholic encephalopathy ?negative neurological ROS ? negative psych ROS  ? GI/Hepatic ?negative GI ROS, (+) Cirrhosis  ?  ?  ? ,   ?Endo/Other  ?negative endocrine ROS ? Renal/GU ?negative Renal ROS  ?negative genitourinary ?  ?Musculoskeletal ? ? Abdominal ?Normal abdominal exam  (+)   ?Peds ?negative pediatric ROS ?(+)  Hematology ?negative hematology ROS ?(+)   ?Anesthesia Other Findings ?Past Medical History: ?No date: Alcohol dependence ([facility]) ?No date: Arthritis ?No date: Cirrhosis, alcoholic ([facility]) ?No date: COPD (chronic obstructive pulmonary disease) ([facility]) ?No date: Encephalopathy ?No date: Hypertension ?No date: Polyneuropathy ? ?Past Surgical History: ?No date: JOINT REPLACEMENT ? ?BMI   ? Body Mass Index: 28.12 kg/m?  ?  ? ? Reproductive/Obstetrics ?negative OB ROS ? ?  ? ? ? ? ? ? ? ? ? ? ? ? ? ?  ?  ? ? ? ? ? ? ? ?Anesthesia Physical ?Anesthesia Plan ? ?ASA: 3 ? ?Anesthesia Plan: General  ? ?Post-op Pain Management:   ? ?Induction: Intravenous ? ?PONV Risk Score and Plan: Propofol infusion and TIVA ? ?Airway Management Planned: Natural Airway and Nasal Cannula ? ?Additional Equipment:  ? ?Intra-op Plan:  ? ?Post-operative Plan:  ? ?Informed Consent: I have reviewed the patients History and Physical, chart, labs and discussed the procedure including the  risks, benefits and alternatives for the proposed anesthesia with the patient or authorized representative who has indicated his/her understanding and acceptance.  ? ? ? ?Dental Advisory Given ? ?Plan Discussed with: Anesthesiologist, CRNA and Surgeon ? ?Anesthesia Plan Comments: (Patient consented for risks of anesthesia including but not limited to:  ?- adverse reactions to medications ?- risk of airway placement if required ?- damage to eyes, teeth, lips or other oral mucosa ?- nerve damage due to positioning  ?- sore throat or hoarseness ?- Damage to heart, brain, nerves, lungs, other parts of body or loss of life ? ?Patient voiced understanding.)  ? ? ? ? ? ?Anesthesia Quick Evaluation ? ?

## 2021-10-12 ENCOUNTER — Encounter: Payer: Self-pay | Admitting: Gastroenterology

## 2021-11-02 ENCOUNTER — Non-Acute Institutional Stay: Payer: Medicare Other | Admitting: Nurse Practitioner

## 2021-11-02 ENCOUNTER — Encounter: Payer: Self-pay | Admitting: Nurse Practitioner

## 2021-11-02 VITALS — BP 137/83 | HR 78 | Temp 97.6°F | Resp 18 | Wt 228.3 lb

## 2021-11-02 DIAGNOSIS — Z515 Encounter for palliative care: Secondary | ICD-10-CM

## 2021-11-02 DIAGNOSIS — R5381 Other malaise: Secondary | ICD-10-CM

## 2021-11-02 DIAGNOSIS — J449 Chronic obstructive pulmonary disease, unspecified: Secondary | ICD-10-CM

## 2021-11-02 DIAGNOSIS — Z716 Tobacco abuse counseling: Secondary | ICD-10-CM

## 2021-11-02 NOTE — Progress Notes (Signed)
? ? ?Manufacturing engineer ?Community Palliative Care Consult Note ?Telephone: [phone]  ?Fax: [phone]  ? ? ?Date of encounter: 11/02/21 ?6:02 PM ?PATIENT NAME: Ryan Dunlap ?[address] ?[place] [address]   ?[phone] (home)  ?DOB: Mar 16, 1956 ?MRN: [id number] ?PRIMARY CARE PROVIDER:    ?[employer] ? ?RESPONSIBLE PARTY:    ?Contact Information   ? ? Name Relation Home Work Mobile  ? Ryan Dunlap, [name] Brother   [phone]  ? ?  ? ?I met face to face with patient in facility. Palliative Care was asked to follow this patient by consultation request of  [place] to address advance care planning and complex medical decision making. This is a follow up visit.                                  ?ASSESSMENT AND PLAN / RECOMMENDATIONS:  ?Symptom Management/Plan: ?ACP: full code with wishes full scope interventions;  ? ?2. Debility secondary to COPD, dysphagia, continue to encourage mobility, strengthening, self independence, continues to improve ? ?3. Tobacco cessation declined ? ?4. Palliative care encounter; Palliative care encounter; Palliative medicine team will continue to support patient, patient's family, and medical team. Visit consisted of counseling and education dealing with the complex and emotionally intense issues of symptom management and palliative care in the setting of serious and potentially life-threatening illness ? ?Follow up Palliative Care Visit: Palliative care will continue to follow for complex medical decision making, advance care planning, and clarification of goals. Return 8 weeks or prn. ? ?I spent 37 minutes providing this consultation starting at 1;30 pm. More than 50% of the time in this consultation was spent in counseling and care coordination. ?PPS: 50% ? ?Chief Complaint: Follow up palliative consult for complex medical decision making ? ?HISTORY OF PRESENT ILLNESS:  Ryan Dunlap is a 66 y.o. year old male  with multiple medical  problems to include COPD, dysphasia, hepatic stenosis, hypertension, hyperlipidemia, substance abuse including alcohol, cocaine abuse, Protein calorie malnutrition. 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 Ryan Dunlap was discharged to [facility] [place] facility where he continues to resides. He does transfer to the wheelchair where he is mobile. Ryan Dunlap does require some assistance with adls bathing, dressing. Ryan. Dunlap does feed himself with fair appetite. Ryan Dunlap is on a heart healthy diet regular texture regular liquid consistency with weight gain. No new changes per staff. Ryan. Dunlap is a full code. Ryan Dunlap does go out to smoke on the smoking patio. At present Ryan. Dunlap is sitting in his w/c where he propels in the hallway, discussed pc visit, Ryan Dunlap in agreement. Ryan Dunlap was cooperative with assessment. We talked about residing at facility, declined smoking cessation. Discussed self independent as much as possible. We talked about appetite, ros, debility, residing at facility, revisited smoking cessation, declined. We talked about quality of life which Ryan Dunlap endorse smoking brings to him. Ryan. Dunlap does socialize with other residents. Medical goals reviewed. Will continue current poc. Emotional support provided. I have attempted to contact Ryan. Dunlap brother for update on pc visit, no new recommendations to poc. I updated staff.  .  ? ?History obtained from review of EMR, discussion with facility staff and Ryan. Dunlap.  ?I reviewed available labs, medications, imaging, studies and related documents from the EMR.  Records reviewed and summarized above.  ? ?  ROS ?10 point system reviewed all negative except HPI ? ?Physical Exam: ?Constitutional: NAD ?General: debilitated pleasant male ?EYES: lids intact ?ENMT: oral mucous membranes moist ?CV: S1S2, RRR ?Pulmonary: LCTA, no increased work of  breathing, no cough, room air ?Abdomen:  normo-active BS + 4 quadrants, soft and non tender ?MSK: w/c dependent ?Skin: warm and dry ?Neuro:  +BLE generalized weakness,  no cognitive impairment ?Psych: non-anxious affect, A and O x 3 ?[name] Dunlap.  The palliative care team will continue to follow. Please call our office at [phone] if we can be of additional assistance.  ? ?Jin Shockley Z Corona Popovich, NP  ?   ?

## 2021-11-16 ENCOUNTER — Encounter: Payer: Self-pay | Admitting: Nurse Practitioner

## 2021-11-16 ENCOUNTER — Non-Acute Institutional Stay: Payer: Medicare Other | Admitting: Nurse Practitioner

## 2021-11-16 VITALS — BP 124/78 | HR 86 | Temp 97.0°F | Resp 18 | Wt 232.4 lb

## 2021-11-16 DIAGNOSIS — J449 Chronic obstructive pulmonary disease, unspecified: Secondary | ICD-10-CM

## 2021-11-16 DIAGNOSIS — K5900 Constipation, unspecified: Secondary | ICD-10-CM

## 2021-11-16 DIAGNOSIS — Z515 Encounter for palliative care: Secondary | ICD-10-CM

## 2021-11-16 DIAGNOSIS — Z716 Tobacco abuse counseling: Secondary | ICD-10-CM

## 2021-11-16 DIAGNOSIS — R5381 Other malaise: Secondary | ICD-10-CM

## 2021-11-16 NOTE — Progress Notes (Addendum)
? ? ?Manufacturing engineer ?Community Palliative Care Consult Note ?Telephone: [phone]  ?Fax: [phone]  ? ? ?Date of encounter: 11/16/21 ?1:58 PM ?PATIENT NAME: Ryan Dunlap ?[address] ?[place] [address]   ?[phone] (home)  ?DOB: 02-Jul-1956 ?MRN: [id number] ?PRIMARY CARE PROVIDER:    ?[employer] ? ?RESPONSIBLE PARTY:    ?Contact Information   ? ? Name Relation Home Work Mobile  ? [place], Dubray Brother   [phone]  ? ?  ? ?I met face to face with patient in facility. Palliative Care was asked to follow this patient by consultation request of  [place] to address advance care planning and complex medical decision making. This is a follow up visit.                                  ?ASSESSMENT AND PLAN / RECOMMENDATIONS:  ?Symptom Management/Plan: ?ACP: full code with wishes full scope interventions;  ?  ?2. Debility secondary to COPD, dysphagia, stable currently continue to encourage mobility, strengthening, self independence, continues to improve ?  ?3. Tobacco cessation re-approached and Mr Resurreccion endorses "I need to quit but not right now" declined ? ?4. Constipation, discussed bowel regimen with last BM 3 days ago. We talked about nutrition, fiber, increase daily water, importance of regular BM's. RN to give formulary, updated.  ?  ?5. Palliative care encounter; Palliative care encounter; Palliative medicine team will continue to support patient, patient's family, and medical team. Visit consisted of counseling and education dealing with the complex and emotionally intense issues of symptom management and palliative care in the setting of serious and potentially life-threatening illness ? ?Follow up Palliative Care Visit: Palliative care will continue to follow for complex medical decision making, advance care planning, and clarification of goals. Return 8 weeks or prn. ? ?I spent 47 minutes providing this consultation. More than 50% of the time in  this consultation was spent in counseling and care coordination. ?PPS: 50% ? ?Chief Complaint: Follow up palliative consult for complex medical decision making ? ?HISTORY OF PRESENT ILLNESS:  Ryan Dunlap is a 66 y.o. year old male  with multiple medical problems to include COPD, dysphasia, hepatic stenosis, hypertension, hyperlipidemia, substance abuse including alcohol, cocaine abuse, Protein calorie malnutrition. 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 Mr. Bathe was discharged to [facility] [place] facility where he continues to resides. Mr Millea does transfer to the wheelchair where he is mobile. Mr Tallman goes where ever he wishes at the facility. Mr Kosmicki does require some assistance with adls bathing, dressing. Mr. Klaus does feed himself with good appetite. No BM since Friday. No new changes per staff. Mr. Hechler is a full code. Mr Brule does go out to smoke on the smoking patio. At present Mr. Soward is sitting in his w/c in his room, we talked about purpose of PC visit. Mr Farnell in agreement. Mr Delage was cooperative with assessment. We talked about residing at facility.  Discussed self independent as much as possible. We talked about appetite, ros, debility, residing at facility, revisited smoking cessation, declined. We talked about quality of life. Mr. Studer does socialize with other residents. We talked about +constipation and bowel regimen. We talked about diet, increasing h2o, regular bm's. Medical goals reviewed. Will continue current poc. Emotional support provided. I have attempted to contact Mr. Mcilhenny brother  for update on pc visit, no new recommendations to poc. I updated staff.  . ? ?History obtained from review of EMR, discussion with faciltiy staff with Mr. Rivet.  ?I reviewed available labs, medications, imaging, studies and related documents from the EMR.  Records reviewed and  summarized above.  ? ?ROS ?10 point system reviewed all negative except HPI ? ?Physical Exam: ?Constitutional: NAD ?General: debilitated pleasant male ?EYES: lids intact ?ENMT: oral mucous membranes moist ?CV: S1S2, RRR ?Pulmonary: LCTA, no increased work of breathing, no cough, room air ?Abdomen:  normo-active BS + 4 quadrants, slightly distended; old scar midline ?MSK: w/c dependent ?Skin: warm and dry ?Neuro:  no generalized weakness,  + cognitive impairment ?Psych: non-anxious affect, A and O x 3 ?Thank you for the opportunity to participate in the care of Mr. Cassada.  The palliative care team will continue to follow. Please call our office at [phone] if we can be of additional assistance.  ? ?Karilynn Carranza Z Jeneva Schweizer, NP   ?

## 2021-11-25 DIAGNOSIS — R202 Paresthesia of skin: Secondary | ICD-10-CM | POA: Insufficient documentation

## 2021-11-25 DIAGNOSIS — G2581 Restless legs syndrome: Secondary | ICD-10-CM

## 2021-11-25 DIAGNOSIS — R200 Anesthesia of skin: Secondary | ICD-10-CM

## 2021-11-25 DIAGNOSIS — R262 Difficulty in walking, not elsewhere classified: Secondary | ICD-10-CM | POA: Insufficient documentation

## 2021-11-25 HISTORY — DX: Restless legs syndrome: G25.81

## 2021-11-25 HISTORY — DX: Anesthesia of skin: R20.0

## 2021-11-26 ENCOUNTER — Telehealth: Payer: Self-pay

## 2021-11-26 NOTE — Telephone Encounter (Signed)
Colonoscopy referral received.  Contacted [facility] to schedule and based on chart review colonoscopy was performed 10/09/21 with Dr. Tobi Bastos.  Noted to be normal.   ?

## 2022-01-20 DIAGNOSIS — Z23 Encounter for immunization: Secondary | ICD-10-CM

## 2022-02-05 IMAGING — DX DG CHEST 1V PORT
1 series · 1 of 1 positions shown · non-contrast
Comparison: None.

CLINICAL DATA: Fever, weakness

EXAM:
PORTABLE CHEST 1 VIEW

[chest ap]
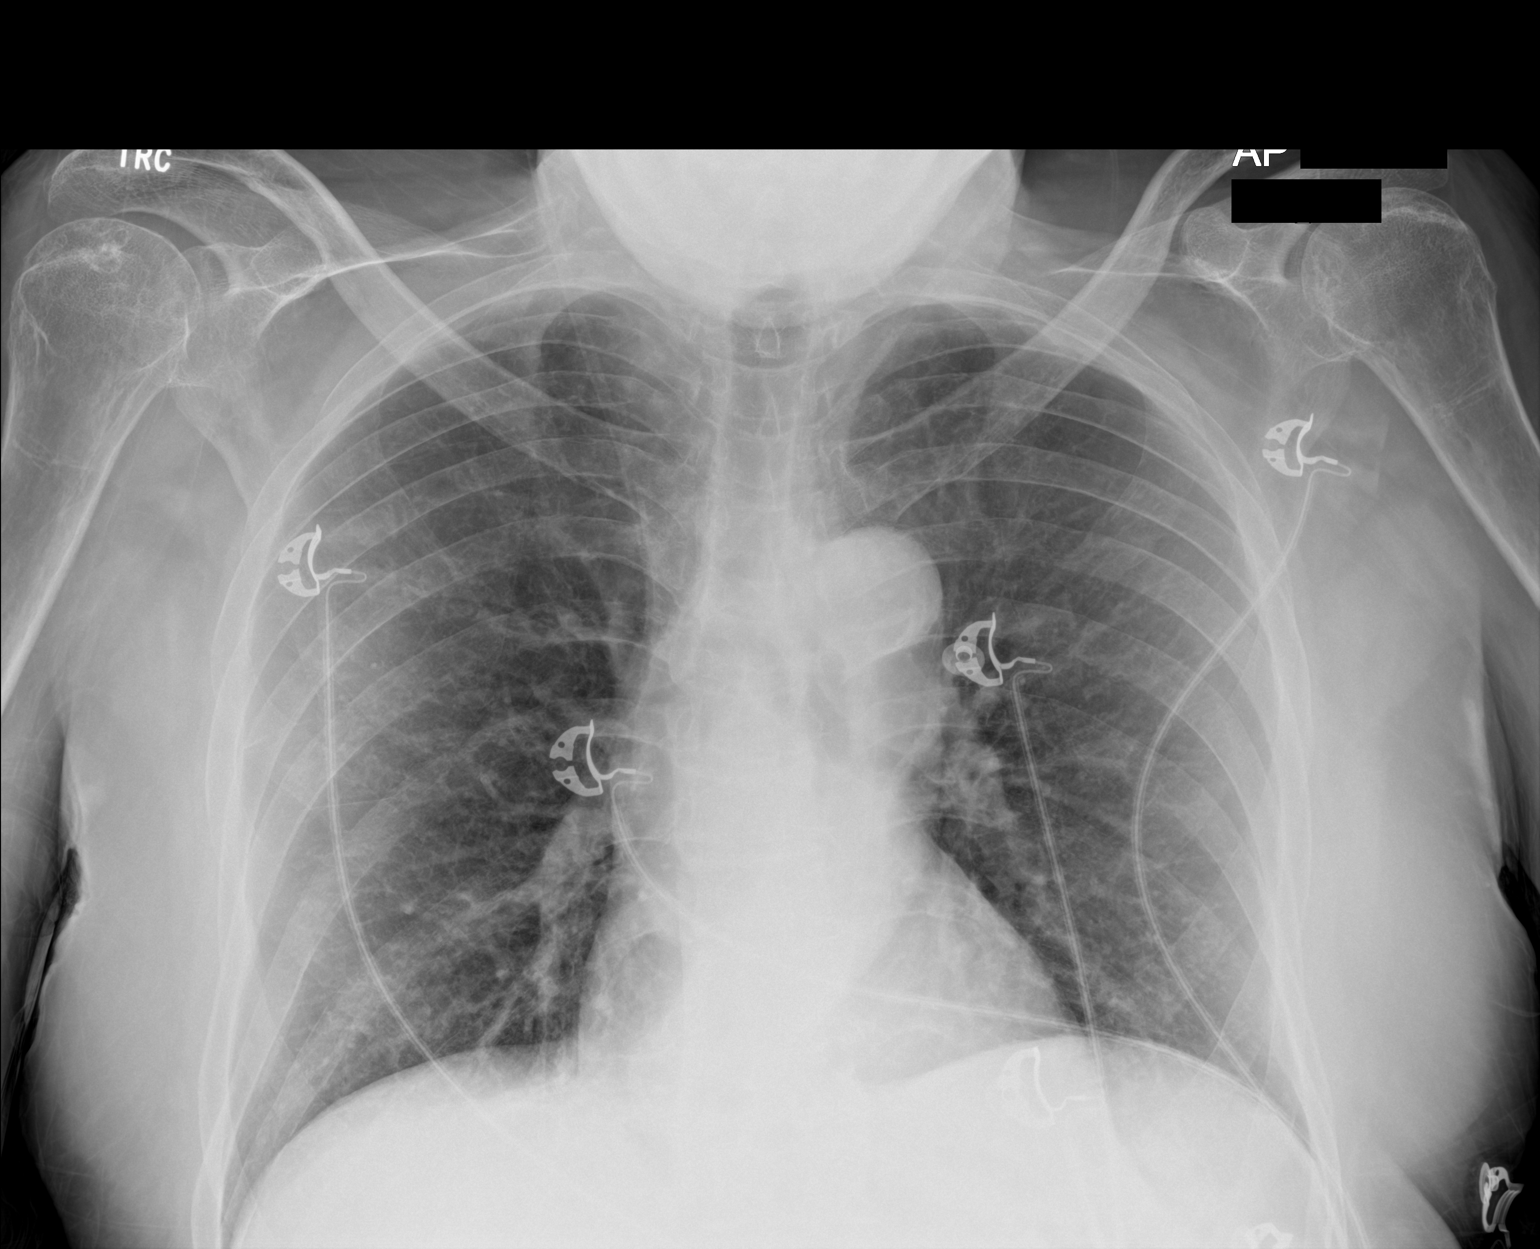

[1 of 1 positions shown; findings below may reference images not displayed]

FINDINGS: Lungs are essentially clear. Mild right basilar opacity, likely
atelectasis. No pleural effusion or pneumothorax.

The heart is normal in size.  Thoracic aortic atherosclerosis.
IMPRESSION: No evidence of acute cardiopulmonary disease.

## 2022-02-15 ENCOUNTER — Non-Acute Institutional Stay: Payer: Medicare Other | Admitting: Nurse Practitioner

## 2022-02-15 ENCOUNTER — Encounter: Payer: Self-pay | Admitting: Nurse Practitioner

## 2022-02-15 VITALS — BP 124/78 | HR 86 | Temp 97.6°F | Resp 18 | Wt 243.0 lb

## 2022-02-15 DIAGNOSIS — Z515 Encounter for palliative care: Secondary | ICD-10-CM

## 2022-02-15 DIAGNOSIS — R5381 Other malaise: Secondary | ICD-10-CM

## 2022-02-15 DIAGNOSIS — J449 Chronic obstructive pulmonary disease, unspecified: Secondary | ICD-10-CM

## 2022-02-15 NOTE — Progress Notes (Signed)
[facility] Consult Note Telephone: [phone]  Fax: [phone]    Date of encounter: 02/15/22 8:14 PM PATIENT NAME: Ryan Dunlap, Ryan Dunlap [address]   [phone] (home)  DOB: June 24, 1956 MRN: [id number] PRIMARY CARE PROVIDER:    [place]:    Contact Information     Name Relation Home Work Mobile   [place]   [phone]       I met face to face with patient in facility. Palliative Care was asked to follow this patient by consultation request of  [place] to address advance care planning and complex medical decision making. This is a follow up visit.                                  ASSESSMENT AND PLAN / RECOMMENDATIONS:  Symptom Management/Plan: ACP: full code with wishes full scope interventions;    2. Debility secondary to COPD, dysphagia, stable currently continue to encourage mobility, strengthening, self independence, continues to improve   3. Tobacco cessation re-approached and Mr Vester endorses  declined   4. Palliative care encounter; Palliative care encounter; Palliative medicine team will continue to support patient, patient's family, and medical team. Visit consisted of counseling and education dealing with the complex and emotionally intense issues of symptom management and palliative care in the setting of serious and potentially life-threatening illness   Follow up Palliative Care Visit: Palliative care will continue to follow for complex medical decision making, advance care planning, and clarification of goals. Return 8 weeks or prn.   I spent 36 minutes providing this consultation starting at 1:00pm. More than 50% of the time in this consultation was spent in counseling and care coordination. PPS: 50%   Chief Complaint: Follow up palliative consult for complex medical decision making   HISTORY OF PRESENT ILLNESS:   Alando Colleran is a 66 y.o. year old male  with multiple medical problems to include COPD, dysphasia, hepatic stenosis, hypertension, hyperlipidemia, substance abuse including alcohol, cocaine abuse, Protein calorie malnutrition. 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 Mr. Cheatwood was discharged to [facility] [place] facility where he continues to resides. Mr Boylen does transfer to the wheelchair where he is mobile. Mr Resnik goes where ever he wishes at the facility. Mr Rushing does require some assistance with adls bathing, dressing. Mr. Tavella does feed himself with good appetite. No new changes per staff, no recent falls, wounds, infections, hospitalizations.  Mr. Parton is a full code. Mr Grunder does go out to smoke on the smoking patio. At present Mr. Thayer is sitting in his w/c at the facility,  we talked about purpose of PC visit. Mr Mendonsa in agreement. Mr Dennin was cooperative with assessment. We talked about appetite, ros, debility, residing at facility, revisited smoking cessation, declined. Mr. Nauta does socialize with other residents.  Medical goals reviewed. Will continue current poc. Emotional support provided. I have attempted to contact Mr. Mehra brother for update on pc visit, no new recommendations to poc. I updated staff.  .   History obtained from review of EMR, discussion with faciltiy staff with Mr. Balsam.  I reviewed available labs, medications, imaging, studies and related documents from the EMR.  Records reviewed and summarized above.    ROS 10 point system reviewed all  negative except HPI   Physical Exam: Constitutional: NAD General: debilitated pleasant male EYES: lids intact ENMT: oral mucous membranes moist CV: S1S2, RRR Pulmonary: LCTA, no increased work of breathing, no cough, room air Abdomen:  normo-active BS + 4 quadrants, slightly distended; old scar  midline MSK: w/c dependent Skin: warm and dry Neuro:  no generalized weakness,  + cognitive impairment Psych: non-anxious affect, A and O x 3  Thank you for the opportunity to participate in the care of Mr. Schreck.  The palliative care team will continue to follow. Please call our office at [phone] if we can be of additional assistance.   Lanique Gonzalo Ihor Gully, NP

## 2022-05-10 ENCOUNTER — Encounter: Payer: Self-pay | Admitting: Nurse Practitioner

## 2022-05-10 ENCOUNTER — Non-Acute Institutional Stay: Payer: Medicare Other | Admitting: Nurse Practitioner

## 2022-05-10 VITALS — BP 158/87 | HR 88 | Temp 97.6°F | Resp 18 | Wt 247.0 lb

## 2022-05-10 DIAGNOSIS — Z515 Encounter for palliative care: Secondary | ICD-10-CM

## 2022-05-10 DIAGNOSIS — J449 Chronic obstructive pulmonary disease, unspecified: Secondary | ICD-10-CM

## 2022-05-10 DIAGNOSIS — K5900 Constipation, unspecified: Secondary | ICD-10-CM

## 2022-05-10 DIAGNOSIS — R5381 Other malaise: Secondary | ICD-10-CM

## 2022-05-10 NOTE — Progress Notes (Addendum)
[facility] Consult Note Telephone: [phone]  Fax: [phone]    Date of encounter: 05/10/22 7:07 PM PATIENT NAME: Ryan Dunlap, Ryan Dunlap [address]   [phone] (home)  DOB: 10-04-1955 MRN: [id number] PRIMARY CARE PROVIDER:    [place]:    Contact Information     Name Relation Home Work Mobile   [place]   [phone]       I met face to face with patient in facility. Palliative Care was asked to follow this patient by consultation request of  [place] to address advance care planning and complex medical decision making. This is a follow up visit.                                  ASSESSMENT AND PLAN / RECOMMENDATIONS:  Symptom Management/Plan: ACP: full code with wishes full scope interventions;    2. Debility secondary to COPD, dysphagia, stable currently continue to encourage mobility, strengthening, self independence, continues to improve   3. Tobacco cessation re-approached and Ryan Dunlap endorses "I need to quit but not right now" declined   4. Constipation, stable, revisited bowel regimen, patterns. We talked about nutrition, fiber, increase daily water, importance of regular BM's.   5. Palliative care encounter; Palliative care encounter; Palliative medicine team will continue to support patient, patient's family, and medical team. Visit consisted of counseling and education dealing with the complex and emotionally intense issues of symptom management and palliative care in the setting of serious and potentially life-threatening illness   Follow up Palliative Care Visit: Palliative care will continue to follow for complex medical decision making, advance care planning, and clarification of goals. Return 8 weeks or prn.   I spent 37 minutes providing this consultation starting at 1:00pm. More than 50% of the time in this  consultation was spent in counseling and care coordination. PPS: 50%   Chief Complaint: Follow up palliative consult for complex medical decision making   HISTORY OF PRESENT ILLNESS:  Ryan Dunlap is a 66 y.o. year old male  with multiple medical problems to include COPD, dysphasia, hepatic stenosis, hypertension, hyperlipidemia, substance abuse including alcohol, cocaine abuse, Protein calorie malnutrition. 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 Ryan. Dunlap was discharged to [facility] [place] facility where he continues to resides. Ryan Dunlap does transfer to the wheelchair where he is mobile. Ryan Dunlap goes where ever he wishes at the facility. Ryan Dunlap does require some assistance with adls bathing, dressing. Ryan. Dunlap does feed himself with good appetite. No BM since Friday. No new changes per staff. Ryan. Dunlap is a full code. Ryan Dunlap does go out to smoke on the smoking patio. At present Ryan. Dunlap is lying in bed, sleeping. Ryan Dunlap awakes to verbal cues. We talked about purpose of pc visit, how he has been feeling, ros, functional abilities, ros, diet, appetite, weights, medications reviewed. Medical goals reviewed. Will continue current poc. Emotional support provided. I have attempted to contact Ryan. Dunlap brother for update on pc visit, no new recommendations to poc. I updated staff.  .   History obtained from review of EMR, discussion with faciltiy staff with Ryan. Dunlap.  I reviewed available labs, medications, imaging, studies and related documents from the EMR.  Records reviewed and summarized  above.    ROS 10 point system reviewed all negative except HPI   Physical Exam: Constitutional: NAD General: debilitated pleasant male EYES: lids intact ENMT: oral mucous membranes moist CV: S1S2, RRR Pulmonary: LCTA, no increased work of breathing, no cough, room air Abdomen:   normo-active BS + 4 quadrants, slightly distended; old scar midline MSK: w/c dependent Skin: warm and dry Neuro:  no generalized weakness,  + cognitive impairment Psych: non-anxious affect, A and O x 3  [name] Dunlap.  The palliative care team will continue to follow. Please call our office at [phone] if we can be of additional assistance.   Ryan Dunlap Ihor Gully, NP

## 2022-05-22 IMAGING — DX DG FOOT COMPLETE 3+V*L*
3 series · 3 of 3 positions shown · non-contrast
Comparison: None.

CLINICAL DATA: Sudden onset of pain and burning.  No injury.

EXAM:
LEFT FOOT - COMPLETE 3+ VIEW

[foot ap]
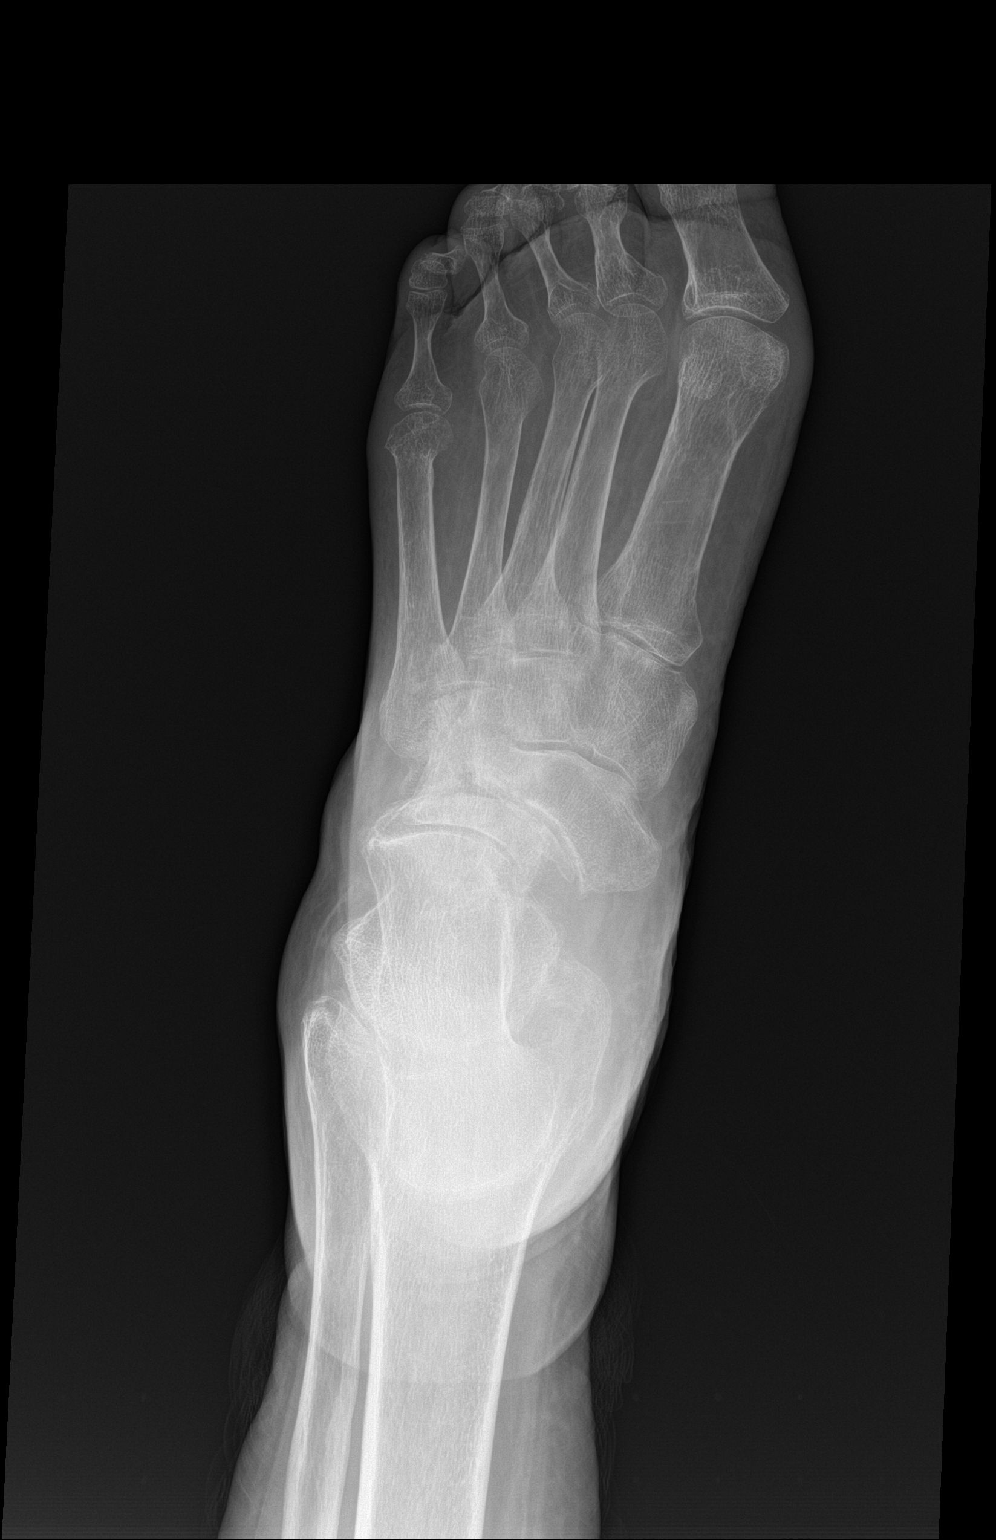

[foot obl]
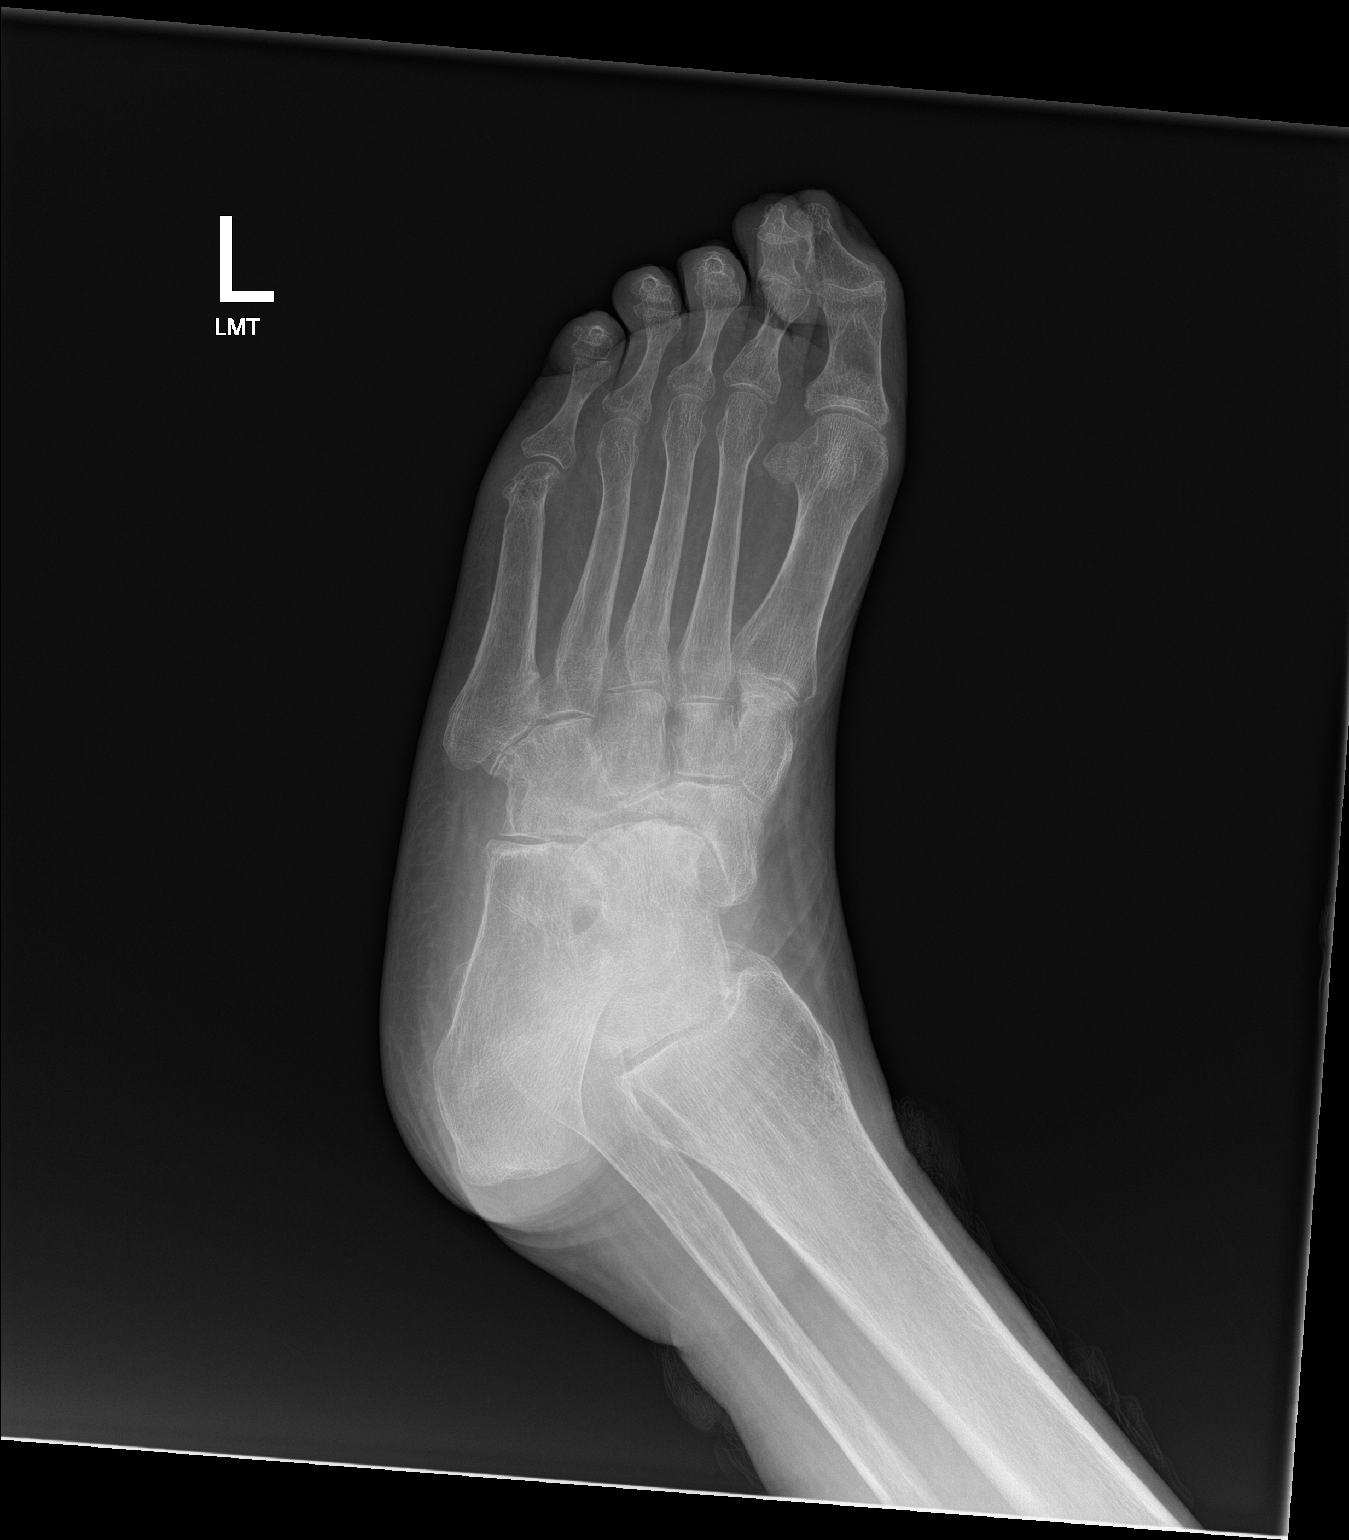

[foot lat]
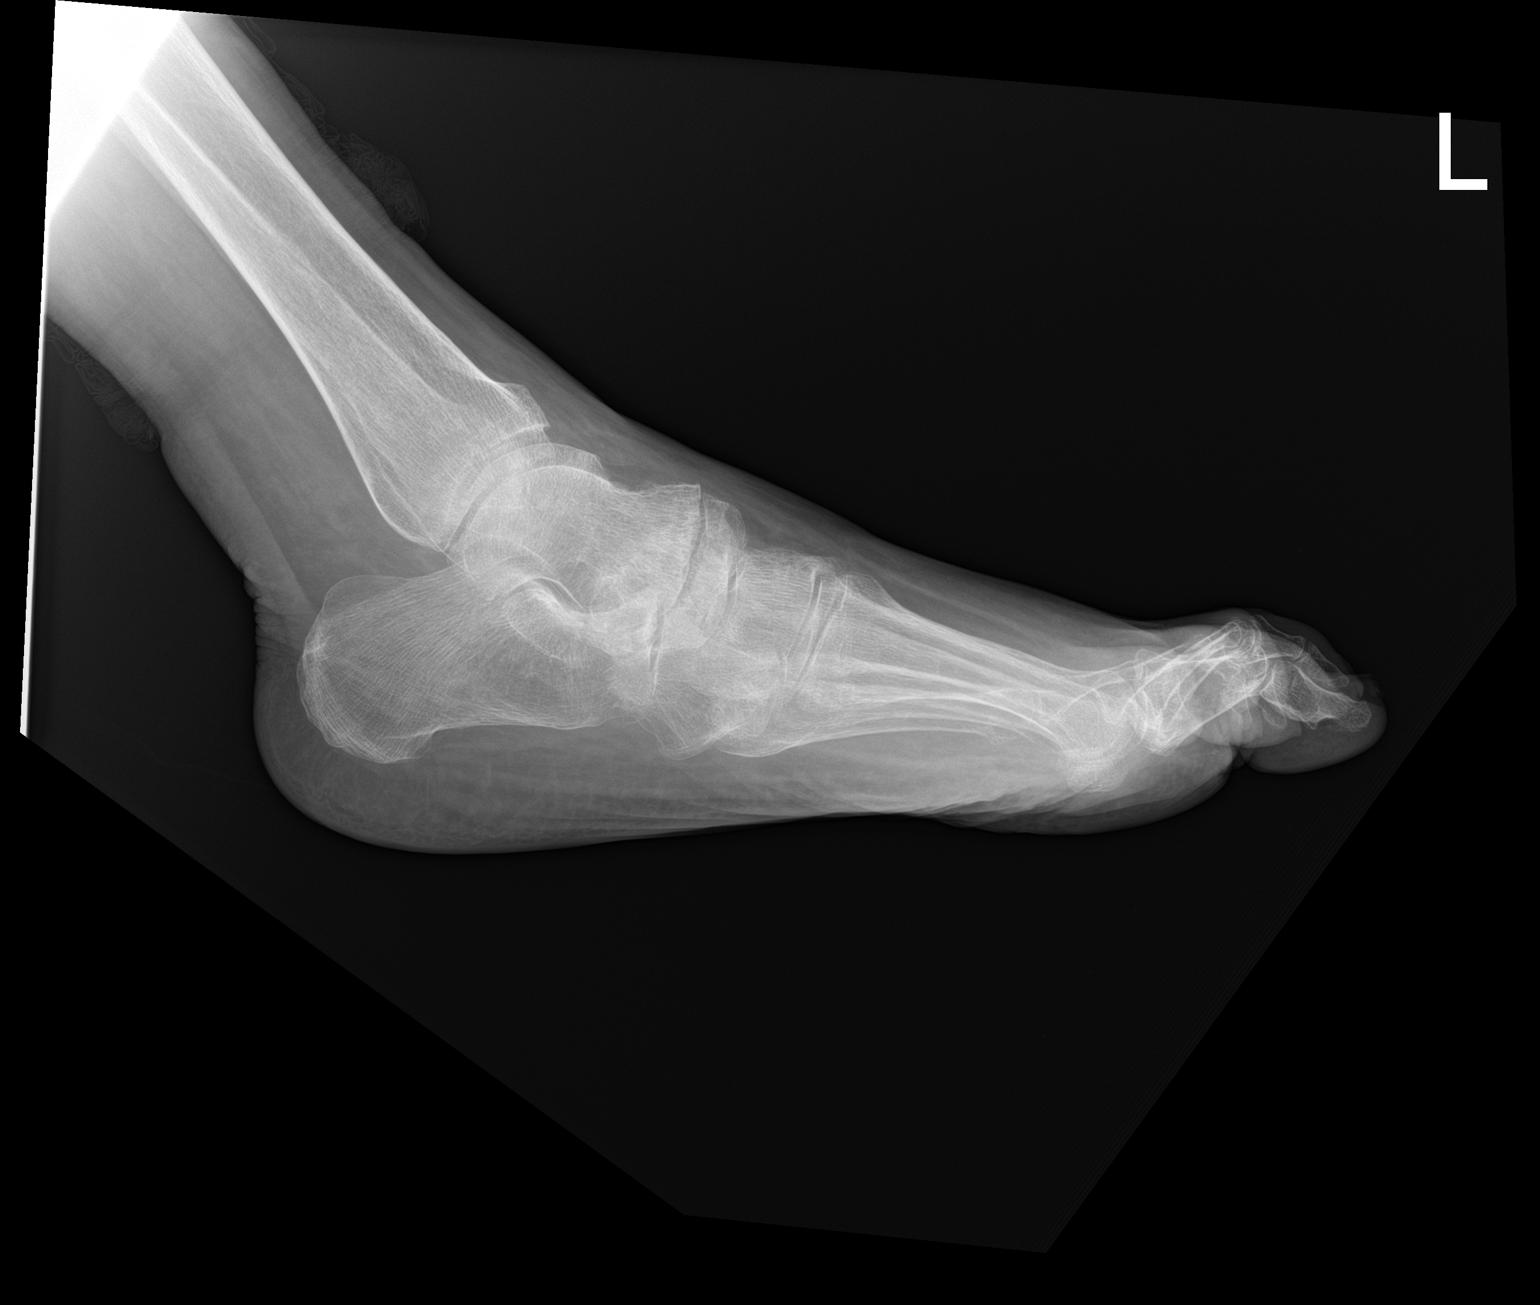

[3 of 3 positions shown; findings below may reference images not displayed]

FINDINGS: Hallux valgus. Osteopenia. No acute osseous or joint abnormality.
Mild degenerative changes in the midfoot.
IMPRESSION: 1. No acute findings.
2. Mild midfoot degenerative changes.

## 2022-06-07 ENCOUNTER — Other Ambulatory Visit: Payer: Self-pay

## 2022-06-07 ENCOUNTER — Emergency Department
Admission: EM | Admit: 2022-06-07 | Discharge: 2022-06-07 | Disposition: A | Discharge status: Home / Self Care | Payer: 59 | Attending: Emergency Medicine | Admitting: Emergency Medicine

## 2022-06-07 ENCOUNTER — Emergency Department: Payer: 59

## 2022-06-07 DIAGNOSIS — N281 Cyst of kidney, acquired: Secondary | ICD-10-CM | POA: Insufficient documentation

## 2022-06-07 DIAGNOSIS — R1032 Left lower quadrant pain: Secondary | ICD-10-CM | POA: Diagnosis present

## 2022-06-07 DIAGNOSIS — R109 Unspecified abdominal pain: Secondary | ICD-10-CM

## 2022-06-07 DIAGNOSIS — J449 Chronic obstructive pulmonary disease, unspecified: Secondary | ICD-10-CM | POA: Diagnosis not present

## 2022-06-07 DIAGNOSIS — I10 Essential (primary) hypertension: Secondary | ICD-10-CM | POA: Insufficient documentation

## 2022-06-07 LAB — CBC
HCT: 26.7 % — ABNORMAL LOW (ref 39.0–52.0)
Hemoglobin: 8.2 g/dL — ABNORMAL LOW (ref 13.0–17.0)
MCH: 26.2 pg (ref 26.0–34.0)
MCHC: 30.7 g/dL (ref 30.0–36.0)
MCV: 85.3 fL (ref 80.0–100.0)
Platelets: 273 10*3/uL (ref 150–400)
RBC: 3.13 MIL/uL — ABNORMAL LOW (ref 4.22–5.81)
RDW: 15.7 % — ABNORMAL HIGH (ref 11.5–15.5)
WBC: 8.2 10*3/uL (ref 4.0–10.5)
nRBC: 0 % (ref 0.0–0.2)

## 2022-06-07 LAB — LIPASE, BLOOD: Lipase: 31 U/L (ref 11–51)

## 2022-06-07 LAB — COMPREHENSIVE METABOLIC PANEL
ALT: 20 U/L (ref 0–44)
AST: 26 U/L (ref 15–41)
Albumin: 3.7 g/dL (ref 3.5–5.0)
Alkaline Phosphatase: 104 U/L (ref 38–126)
Anion gap: 8 (ref 5–15)
BUN: 11 mg/dL (ref 8–23)
CO2: 23 mmol/L (ref 22–32)
Calcium: 9 mg/dL (ref 8.9–10.3)
Chloride: 109 mmol/L (ref 98–111)
Creatinine, Ser: 0.78 mg/dL (ref 0.61–1.24)
GFR, Estimated: >60 mL/min (ref >60)
Glucose, Bld: 104 mg/dL — ABNORMAL HIGH (ref 70–99)
Potassium: 4.1 mmol/L (ref 3.5–5.1)
Sodium: 140 mmol/L (ref 135–145)
Total Bilirubin: 0.5 mg/dL (ref 0.3–1.2)
Total Protein: 8 g/dL (ref 6.5–8.1)

## 2022-06-07 NOTE — ED Provider Notes (Signed)
[facility] Provider Note    Event Date/Time   First MD Initiated Contact with Patient 06/07/22 1213     (approximate)   History   Abdominal Pain   HPI  Ryan Dunlap is a 66 y.o. male with a history of COPD, hypertension brought in for complaints of flank/abdominal pain.  Patient describes left-sided flank pain with radiation to his groin.  He reports it is improved currently.  No dysuria.  No fevers chills nausea or vomiting.  Normal stools.     Physical Exam   Triage Vital Signs: ED Triage Vitals  Enc Vitals Group     BP 06/07/22 1139 (!) 144/90     Pulse Rate 06/07/22 1139 85     Resp 06/07/22 1139 18     Temp 06/07/22 1139 98 F (36.7 C)     Temp Source 06/07/22 1139 Oral     SpO2 06/07/22 1139 99 %     Weight 06/07/22 1214 112 kg (246 lb 14.6 oz)     Height 06/07/22 1214 1.93 m (6\' 4" )     Head Circumference --      Peak Flow --      Pain Score 06/07/22 1139 8     Pain Loc --      Pain Edu? --      Excl. in [place]? --     Most recent vital signs: Vitals:   06/07/22 1139  BP: (!) 144/90  Pulse: 85  Resp: 18  Temp: 98 F (36.7 C)  SpO2: 99%     General: Awake, no distress.  CV:  Good peripheral perfusion.  Resp:  Normal effort.  Abd:  No distention.  Soft, nontender, no CVA tenderness, reassuring exam Other:     ED Results / Procedures / Treatments   Labs (all labs ordered are listed, but only abnormal results are displayed) Labs Reviewed  COMPREHENSIVE METABOLIC PANEL - Abnormal; Notable for the following components:      Result Value   Glucose, Bld 104 (*)    All other components within normal limits  CBC - Abnormal; Notable for the following components:   RBC 3.13 (*)    Hemoglobin 8.2 (*)    HCT 26.7 (*)    RDW 15.7 (*)    All other components within normal limits  LIPASE, BLOOD  URINALYSIS, ROUTINE W REFLEX MICROSCOPIC     EKG     RADIOLOGY CT renal stone study viewed by me, no kidney  stone    PROCEDURES:  Critical Care performed:   Procedures   MEDICATIONS ORDERED IN ED: Medications - No data to display   IMPRESSION / MDM / [place] / ED COURSE  I reviewed the triage vital signs and the nursing notes. Patient's presentation is most consistent with acute presentation with potential threat to life or bodily function.   Patient presents with flank pain as detailed above.  Differential includes ureterolithiasis, diverticulitis, nonspecific pain.  Overall well-appearing and asymptomatic here in the emergency department.  Lab work reviewed and is unchanged from prior.  No dysuria to suggest UTI.  CT scan performed which was reassuring, recommend patient follow-up closely with PCP for cyst on his left kidney  No indication for admission, discharged with outpatient follow-up       FINAL CLINICAL IMPRESSION(S) / ED DIAGNOSES   Final diagnoses:  Flank pain  Kidney cysts     Rx / DC Orders   ED Discharge Orders  None        Note:  This document was prepared using Dragon voice recognition software and may include unintentional dictation errors.   Jene Every, MD 06/07/22 1453

## 2022-06-07 NOTE — ED Triage Notes (Signed)
Pt to ED via [facility] from [facility]. Pt reports left sided abdominal pain intermittent x1-2 weeks. Pt reports hx hernia repair and states 2 months ago is hernia incision opened and they had to dress it. Incision is closed at this time. Pt also reports rash to left side of abdomen and back.   EMS VS:  BP 177/91 100% RA 88 HR Temp 98

## 2022-06-07 NOTE — Discharge Instructions (Signed)
Please discuss kidney cyst seen on CT scan with your PCP

## 2022-06-29 ENCOUNTER — Other Ambulatory Visit: Payer: Self-pay | Admitting: Otolaryngology

## 2022-06-29 DIAGNOSIS — R1310 Dysphagia, unspecified: Secondary | ICD-10-CM

## 2022-06-29 DIAGNOSIS — Q388 Other congenital malformations of pharynx: Secondary | ICD-10-CM

## 2022-07-06 ENCOUNTER — Non-Acute Institutional Stay: Payer: 59 | Admitting: Nurse Practitioner

## 2022-07-06 ENCOUNTER — Encounter: Payer: Self-pay | Admitting: Nurse Practitioner

## 2022-07-06 VITALS — BP 144/70 | HR 87 | Resp 18 | Wt 237.6 lb

## 2022-07-06 DIAGNOSIS — G4701 Insomnia due to medical condition: Secondary | ICD-10-CM

## 2022-07-06 DIAGNOSIS — Z515 Encounter for palliative care: Secondary | ICD-10-CM

## 2022-07-06 DIAGNOSIS — J449 Chronic obstructive pulmonary disease, unspecified: Secondary | ICD-10-CM

## 2022-07-06 DIAGNOSIS — R5381 Other malaise: Secondary | ICD-10-CM

## 2022-07-06 NOTE — Progress Notes (Unsigned)
[facility] Consult Note Telephone: [phone]  Fax: [phone]    Date of encounter: 07/06/22 3:43 PM PATIENT NAME: [place], [place] [address]   [phone] (home)  DOB: March 18, 1956 MRN: [id number] PRIMARY CARE PROVIDER:    [place]:    Contact Information       Name Relation Home Work Mobile    [place]     [phone]         I met face to face with patient in facility. Palliative Care was asked to follow this patient by consultation request of  [place] to address advance care planning and complex medical decision making. This is a follow up visit.                                  ASSESSMENT AND PLAN / RECOMMENDATIONS:  Symptom Management/Plan: ACP: full code with wishes full scope interventions;    2. Debility secondary to COPD, dysphagia, stable currently continue to encourage mobility, strengthening, self independence, continues to improve   3. nsomnia, discussed sleep patterns, sleep hygiene. We talked about importance of getting himself on a day time awake and sleeping at night.    4. Palliative care encounter; Palliative care encounter; Palliative medicine team will continue to support patient, patient's family, and medical team. Visit consisted of counseling and education dealing with the complex and emotionally intense issues of symptom management and palliative care in the setting of serious and potentially life-threatening illness   Follow up Palliative Care Visit: Palliative care will continue to follow for complex medical decision making, advance care planning, and clarification of goals. Return 4 to 8 weeks or prn.   I spent 45 minutes providing this consultation starting at 3:50pm. More than 50% of the time in this consultation was spent in counseling and care coordination. PPS: 50%   Chief Complaint: Follow up  palliative consult for complex medical decision making   HISTORY OF PRESENT ILLNESS:  Amore Grater is a 66 y.o. year old male  with multiple medical problems to include COPD, dysphasia, hepatic stenosis, hypertension, hyperlipidemia, substance abuse including alcohol, cocaine abuse, Protein calorie malnutrition. 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 Mr. Arkwright was discharged to [facility] [place] facility where he continues to resides. Mr Moline does transfer to the wheelchair where he is mobile. Mr Jeanbaptiste goes where ever he wishes at the facility. Mr Parker does require some assistance with adls bathing, dressing. Mr. Sereno does feed himself with good appetite. Mr. Attwood is a full code. Mr Higinbotham does continue to go out to smoke on the smoking patio. At present Mr. Roblero is lying in bed, sleeping. Mr Karnes awakes to verbal cues. We talked about purpose of pc visit, how he has been feeling, functional abilities, ros, diet, appetite, weights, medications reviewed. We talked about sleep patterns seeping all day, up all night. We talked about sleep hygiene. We talked about trying to get his sleep pattern to where he is sleeping at night and up during the day. We talked about facility activities. Medical goals reviewed. Will continue current poc. Emotional support provided. I have attempted to contact Mr. Stemm brother for update on pc visit, no new recommendations to poc. I updated staff.  .   History obtained from  review of EMR, discussion with faciltiy staff with Mr. Tallo.  I reviewed available labs, medications, imaging, studies and related documents from the EMR.  Records reviewed and summarized above.    ROS 10 point system reviewed all negative except HPI   Physical Exam: Constitutional: NAD General: debilitated pleasant male EYES: lids intact ENMT: oral mucous membranes moist CV:  S1S2, RRR Pulmonary: LCTA, no increased work of breathing, no cough, room air Abdomen:  normo-active BS + 4 quadrants, slightly distended; old scar midline MSK: w/c dependent Skin: warm and dry Neuro:  no generalized weakness,  + cognitive impairment Psych: non-anxious affect, A and O x 3   Thank you for the opportunity to participate in the care of Mr. Fiero. Please call our office at [phone] if we can be of additional assistance.   Alyce Inscore Ihor Gully, NP

## 2022-07-22 ENCOUNTER — Ambulatory Visit: Payer: 59 | Attending: Otolaryngology

## 2022-08-19 ENCOUNTER — Other Ambulatory Visit: Payer: Self-pay | Admitting: Family Medicine

## 2022-08-19 DIAGNOSIS — M5416 Radiculopathy, lumbar region: Secondary | ICD-10-CM

## 2022-08-20 ENCOUNTER — Ambulatory Visit
Admission: RE | Admit: 2022-08-20 | Discharge: 2022-08-20 | Disposition: A | Discharge status: Home / Self Care | Payer: 59 | Source: Ambulatory Visit | Attending: Family Medicine | Admitting: Family Medicine

## 2022-08-20 DIAGNOSIS — M5416 Radiculopathy, lumbar region: Secondary | ICD-10-CM | POA: Diagnosis present

## 2022-08-30 ENCOUNTER — Ambulatory Visit: Payer: 59

## 2022-09-01 DIAGNOSIS — M5416 Radiculopathy, lumbar region: Secondary | ICD-10-CM | POA: Insufficient documentation

## 2022-09-01 DIAGNOSIS — G8929 Other chronic pain: Secondary | ICD-10-CM | POA: Insufficient documentation

## 2022-09-01 DIAGNOSIS — S32010A Wedge compression fracture of first lumbar vertebra, initial encounter for closed fracture: Secondary | ICD-10-CM | POA: Insufficient documentation

## 2022-09-01 HISTORY — DX: Other chronic pain: G89.29

## 2022-09-01 HISTORY — DX: Wedge compression fracture of first lumbar vertebra, initial encounter for closed fracture: S32.010A

## 2022-09-01 HISTORY — DX: Radiculopathy, lumbar region: M54.16

## 2022-09-24 ENCOUNTER — Non-Acute Institutional Stay: Payer: 59 | Admitting: Nurse Practitioner

## 2022-09-24 ENCOUNTER — Encounter: Payer: Self-pay | Admitting: Nurse Practitioner

## 2022-09-24 VITALS — BP 125/79 | HR 74 | Temp 97.1°F | Resp 18 | Wt 248.2 lb

## 2022-09-24 DIAGNOSIS — Z515 Encounter for palliative care: Secondary | ICD-10-CM

## 2022-09-24 DIAGNOSIS — J449 Chronic obstructive pulmonary disease, unspecified: Secondary | ICD-10-CM

## 2022-09-24 DIAGNOSIS — R5381 Other malaise: Secondary | ICD-10-CM

## 2022-09-24 DIAGNOSIS — G4701 Insomnia due to medical condition: Secondary | ICD-10-CM

## 2022-09-24 NOTE — Progress Notes (Signed)
[facility] Consult Note Telephone: [phone]  Fax: [phone]    Date of encounter: 09/24/22 7:54 PM PATIENT NAME: Ryan Dunlap, [place] [address]   [phone] (home)  DOB: 1956/02/04 MRN: [id number] PRIMARY CARE PROVIDER:    [place]:    Contact Information     Name Relation Home Work Mobile   [place]   [phone]       I met face to face with patient in facility. Palliative Care was asked to follow this patient by consultation request of  [place] to address advance care planning and complex medical decision making. This is a follow up visit.                                  ASSESSMENT AND PLAN / RECOMMENDATIONS:  Symptom Management/Plan: 1.ACP: full code with wishes full scope interventions;    2. Debility secondary to COPD, dysphagia, stable currently continue to encourage mobility, strengthening, self independence, continues to improve   3. Insomnia, discussed sleep patterns, sleep hygiene. We talked about importance of getting himself on a day time awake and sleeping at night.    4. Palliative care encounter; Palliative care encounter; Palliative medicine team will continue to support patient, patient's family, and medical team. Visit consisted of counseling and education dealing with the complex and emotionally intense issues of symptom management and palliative care in the setting of serious and potentially life-threatening illness   Follow up Palliative Care Visit: Palliative care will continue to follow for complex medical decision making, advance care planning, and clarification of goals. Return 4 to 8 weeks or prn.   I spent 74mnutes providing this consultation starting at 3:50pm. More than 50% of the time in this consultation was spent in counseling and care coordination. PPS: 50%   Chief Complaint: Follow up  palliative consult for complex medical decision making   HISTORY OF PRESENT ILLNESS:  CTerrol Puskarichis a 67y.o. year old male  with multiple medical problems to include COPD, dysphasia, hepatic stenosis, hypertension, hyperlipidemia, substance abuse including alcohol, cocaine abuse, Protein calorie malnutrition. 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 [facility]. Mr BThornhilldoes transfer to the wheelchair where he is mobile. Mr BDecrescenzogoes where ever he wishes at the facility. Mr BParkhousedoes require some assistance with adls bathing, dressing. Mr. BDisotelldoes feed himself with good appetite. Mr. BBuissonis a full code. I visited and observed Mr BBrisk he is sitting on his bed, eating lunch. Mr BObergwas talking at length about his food, food he likes, food he does not like. We talked about ros, functional abilities w/c dependent. We talked about quality of life, residing at facility, continues to go to smoking patio, declined smoking cessation. We talked about disease progression of COPD though limited. Supportive visit. Medications, poc, goc, primary provider notes reviewed.   I have attempted to contact Mr. BEchardbrother for update on pc visit, no new recommendations to poc. I updated staff.  .   History obtained from review of EMR, discussion with faciltiy staff with Mr. BBruggeman  I reviewed available labs, medications, imaging, studies and related documents from the EMR.  Records reviewed and summarized above.  Physical Exam: General: debilitated pleasant male ENMT: oral mucous membranes moist CV: S1S2, RRR Pulmonary: LCTA Abdomen:  normo-active BS + 4 quadrants, slightly distended; old scar midline Neuro:  no generalized weakness Psych: non-anxious affect, A and O x 3 Thank you for the opportunity to participate  in the care of Mr. Primus. Please call our office at [phone] if we can be of additional assistance.   Cynithia Hakimi Ihor Gully, NP

## 2022-11-10 ENCOUNTER — Non-Acute Institutional Stay: Payer: 59 | Admitting: Nurse Practitioner

## 2022-11-10 ENCOUNTER — Encounter: Payer: Self-pay | Admitting: Nurse Practitioner

## 2022-11-10 VITALS — BP 147/80 | HR 93 | Temp 96.9°F | Resp 16 | Wt 247.3 lb

## 2022-11-10 DIAGNOSIS — Z515 Encounter for palliative care: Secondary | ICD-10-CM

## 2022-11-10 DIAGNOSIS — J449 Chronic obstructive pulmonary disease, unspecified: Secondary | ICD-10-CM

## 2022-11-10 DIAGNOSIS — G4701 Insomnia due to medical condition: Secondary | ICD-10-CM

## 2022-11-10 DIAGNOSIS — R0602 Shortness of breath: Secondary | ICD-10-CM

## 2022-11-10 NOTE — Progress Notes (Signed)
Therapist, nutritional Palliative Care Consult Note Telephone: [phone]  Fax: [phone]    Date of encounter: 11/10/22 3:03 PM PATIENT NAME: Anothny Levis [address], Room [address] [place] [address]   [phone] (home)  DOB: [birth year] MRN: [id number] PRIMARY CARE PROVIDER:    [facility]  RESPONSIBLE PARTY:    Contact Information     Name Relation Home Work Mobile   [place]   [phone]      I met face to face with patient in facility. Palliative Care was asked to follow this patient by consultation request of  [facility] to address advance care planning and complex medical decision making. This is a follow up visit.                                  ASSESSMENT AND PLAN / RECOMMENDATIONS:  Symptom Management/Plan: 1.ACP: full code with wishes full scope interventions;    2. Shortness of breath secondary to COPD stable currently continue to encourage mobility, strengthening, self independence, encourage smoking cessation; continue current medication regimen.   3. Insomnia, discussed sleep patterns, sleep hygiene. We talked about importance of getting himself on a day time awake and sleeping at night. Followed by psych with last visit 10/25/2022, on depakote for insomnia with agitation;    4. Palliative care encounter; Palliative care encounter; Palliative medicine team will continue to support patient, patient's family, and medical team. Visit consisted of counseling and education dealing with the complex and emotionally intense issues of symptom management and palliative care in the setting of serious and potentially life-threatening illness   Follow up Palliative Care Visit: Palliative care will continue to follow for complex medical decision making, advance care planning, and clarification of goals. Return 4 to 8 weeks or prn.   I spent 45 minutes providing this consultation starting at 11:30 am. More than  50% of the time in this consultation was spent in counseling and care coordination. PPS: 50%   Chief Complaint: Follow up palliative consult for complex medical decision making   HISTORY OF PRESENT ILLNESS:  Jarin Hallquist is a 67 y.o. year old male  with multiple medical problems to include COPD, dysphasia, hepatic stenosis, hypertension, hyperlipidemia, substance abuse including alcohol, cocaine abuse, Protein calorie malnutrition. 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 Mr. Delashmutt was discharged to [facility] facility where he continues to resides. Mr Bukoski does transfer to the wheelchair where he is mobile. Mr Jenniges goes where ever he wishes at the facility. Mr Kleck does require some assistance with adls bathing, dressing. Mr. Blaise does feed himself with good appetite. Mr. Addis is a full code. I visited and observed Mr Mazmanian, he is sitting in his w/c high back listening to his music in his room. Mr Ipock was seen by psychiatry 10/25/2022 for depression/anxiety which he takes duloxetine; insomnia with agitation taking depakote. Purpose of today PC f/u visit further discussion monitor trends of appetite, weights, monitor for functional, cognitive decline with chronic disease progression, assess any active symptoms, supportive role. I visited and observed Mr Schiess, talked about purpose of pc visit. We talked about ros, currently pain controlled, sleeping/insomnia improving. Mr Najera and I talked about residing at facility, challenges with his music. He carries his radio with him playing music often loud with staff having to remind him  to turn down. We talked about his daily routine, activities he enjoys including going to the smoking patio to smoke. Mr Saballos declines smoking cessation. We talked about overall asymptomatic and stable. Medications, provider/nursing notes, poc, goc  reviewed, continue current poc. Encourage smoking cessation.  PC f/u visit further discussion monitor trends of appetite, weights, monitor for functional, cognitive decline with chronic disease progression, assess any active symptoms, supportive role.   I have attempted to contact Mr. Tavella brother for update on pc visit.   History obtained from review of EMR, discussion with faciltiy staff with Mr. Cloke.  I reviewed available labs, medications, imaging, studies and related documents from the EMR.  Records reviewed and summarized above.    Physical Exam: General: debilitated male ENMT: oral mucous membranes moist CV: S1S2, RRR Pulmonary: LCTAold scar midline Neuro:  no generalized weakness Psych: non-anxious affect, A and O x 3  Thank you for the opportunity to participate in the care of Mr. Feick. Please call our office at [phone] if we can be of additional assistance.   Guss Farruggia Prince [place], NP

## 2022-12-28 DIAGNOSIS — G629 Polyneuropathy, unspecified: Secondary | ICD-10-CM | POA: Insufficient documentation

## 2022-12-28 DIAGNOSIS — F1120 Opioid dependence, uncomplicated: Secondary | ICD-10-CM | POA: Insufficient documentation

## 2022-12-28 DIAGNOSIS — M47816 Spondylosis without myelopathy or radiculopathy, lumbar region: Secondary | ICD-10-CM | POA: Insufficient documentation

## 2022-12-28 HISTORY — DX: Spondylosis without myelopathy or radiculopathy, lumbar region: M47.816

## 2022-12-28 HISTORY — DX: Polyneuropathy, unspecified: G62.9

## 2022-12-28 HISTORY — DX: Opioid dependence, uncomplicated: F11.20

## 2023-07-31 ENCOUNTER — Other Ambulatory Visit: Payer: Self-pay

## 2023-07-31 ENCOUNTER — Emergency Department: Payer: 59

## 2023-07-31 ENCOUNTER — Emergency Department
Admission: EM | Admit: 2023-07-31 | Discharge: 2023-07-31 | Disposition: A | Discharge status: Home / Self Care | Payer: 59 | Attending: Emergency Medicine | Admitting: Emergency Medicine

## 2023-07-31 DIAGNOSIS — D649 Anemia, unspecified: Secondary | ICD-10-CM | POA: Insufficient documentation

## 2023-07-31 DIAGNOSIS — M25512 Pain in left shoulder: Secondary | ICD-10-CM | POA: Insufficient documentation

## 2023-07-31 DIAGNOSIS — R0789 Other chest pain: Secondary | ICD-10-CM | POA: Insufficient documentation

## 2023-07-31 LAB — CBC
HCT: 25.6 % — ABNORMAL LOW (ref 39.0–52.0)
Hemoglobin: 7.3 g/dL — ABNORMAL LOW (ref 13.0–17.0)
MCH: 23.7 pg — ABNORMAL LOW (ref 26.0–34.0)
MCHC: 28.5 g/dL — ABNORMAL LOW (ref 30.0–36.0)
MCV: 83.1 fL (ref 80.0–100.0)
Platelets: 340 10*3/uL (ref 150–400)
RBC: 3.08 MIL/uL — ABNORMAL LOW (ref 4.22–5.81)
RDW: 15.5 % (ref 11.5–15.5)
WBC: 6 10*3/uL (ref 4.0–10.5)
nRBC: 0 % (ref 0.0–0.2)

## 2023-07-31 LAB — COMPREHENSIVE METABOLIC PANEL
ALT: 20 U/L (ref 0–44)
AST: 27 U/L (ref 15–41)
Albumin: 3.9 g/dL (ref 3.5–5.0)
Alkaline Phosphatase: 66 U/L (ref 38–126)
Anion gap: 14 (ref 5–15)
BUN: 12 mg/dL (ref 8–23)
CO2: 22 mmol/L (ref 22–32)
Calcium: 9 mg/dL (ref 8.9–10.3)
Chloride: 104 mmol/L (ref 98–111)
Creatinine, Ser: 0.75 mg/dL (ref 0.61–1.24)
GFR, Estimated: >60 mL/min (ref >60)
Glucose, Bld: 99 mg/dL (ref 70–99)
Potassium: 3.5 mmol/L (ref 3.5–5.1)
Sodium: 140 mmol/L (ref 135–145)
Total Bilirubin: 0.5 mg/dL (ref <1.2)
Total Protein: 7.7 g/dL (ref 6.5–8.1)

## 2023-07-31 LAB — TROPONIN I (HIGH SENSITIVITY)
Troponin I (High Sensitivity): 6 ng/L (ref <18)
Troponin I (High Sensitivity): 6 ng/L (ref <18)

## 2023-07-31 MED ORDER — LIDOCAINE 5 % EX PTCH
1.0000 | MEDICATED_PATCH | CUTANEOUS | Status: DC
Start: 2023-07-31 — End: 2023-07-31
  Administered 2023-07-31: 1 via TRANSDERMAL
  Filled 2023-07-31: qty 1

## 2023-07-31 MED ORDER — ACETAMINOPHEN 500 MG PO TABS
1000.0000 mg | ORAL_TABLET | Freq: Once | ORAL | Status: AC
  Administered 2023-07-31: 1000 mg via ORAL
  Filled 2023-07-31: qty 2

## 2023-07-31 MED ORDER — LIDOCAINE 5 % EX PTCH: 1.0000 | MEDICATED_PATCH | Freq: Two times a day (BID) | CUTANEOUS | 0 refills | Status: AC

## 2023-07-31 NOTE — ED Provider Notes (Signed)
[facility] Provider Note    Event Date/Time   First MD Initiated Contact with Patient 07/31/23 1020     (approximate)   History   Chest Pain   HPI  Ryan Dunlap is a 67 y.o. male who comes in from [facility] due to concern for left sided chest pain has been going on for about 12 hours.  He reports that he felt kind of constipated and he was trying to have a bowel movement he developed some pain in his left shoulder.  The pain is worse when you push on it.  He denies having been given anything to help with the discomfort.  He denies any history of heart attacks.  Patient is legally blind so unknown if there is any been any black stool.  He denies any abdominal pain, shortness of breath or other concerns.  Physical Exam   Triage Vital Signs: ED Triage Vitals [07/31/23 0614]  Encounter Vitals Group     BP (!) 144/90     Systolic BP Percentile      Diastolic BP Percentile      Pulse Rate 87     Resp 18     Temp 97.8 F (36.6 C)     Temp Source Oral     SpO2 98 %     Weight 235 lb (106.6 kg)     Height 6\' 4"  (1.93 m)     Head Circumference      Peak Flow      Pain Score 10     Pain Loc      Pain Education      Exclude from Growth Chart     Most recent vital signs: Vitals:   07/31/23 0614 07/31/23 0827  BP: (!) 144/90 137/88  Pulse: 87 81  Resp: 18 18  Temp: 97.8 F (36.6 C) 97.9 F (36.6 C)  SpO2: 98% 100%     General: Awake, no distress.  CV:  Good peripheral perfusion.  Resp:  Normal effort.  Abd:  No distention.  Other:  Patient has point tenderness over his trapezius muscle on the left side.  He is able to lift up his arm and range it without difficulties.  There is no redness or warmth noted of the shoulder.  His abdomen is soft and nontender.  Rectal exam shows brown stool no stool ball noted.   ED Results / Procedures / Treatments   Labs (all labs ordered are listed, but only abnormal results are displayed) Labs  Reviewed  CBC - Abnormal; Notable for the following components:      Result Value   RBC 3.08 (*)    Hemoglobin 7.3 (*)    HCT 25.6 (*)    MCH 23.7 (*)    MCHC 28.5 (*)    All other components within normal limits  COMPREHENSIVE METABOLIC PANEL  TROPONIN I (HIGH SENSITIVITY)  TROPONIN I (HIGH SENSITIVITY)     EKG  My interpretation of EKG:  Normal sinus rate of 87 without any ST elevation or T wave inversions, normal troubles  RADIOLOGY I have reviewed the xray personally and interpreted and no evidence of any pneumonia  MPRESSION: 1. No evidence of acute chest disease. Aortic atherosclerosis. Stable exam. 2. Mild osteopenia with chronic L1 compression fracture.   PROCEDURES:  Critical Care performed: No  Procedures   MEDICATIONS ORDERED IN ED: Medications  acetaminophen (TYLENOL) tablet 1,000 mg (has no administration in time range)  lidocaine (LIDODERM) 5 %  1 patch (has no administration in time range)     IMPRESSION / MDM / ASSESSMENT AND PLAN / ED COURSE  I reviewed the triage vital signs and the nursing notes.   Patient's presentation is most consistent with acute presentation with potential threat to life or bodily function.   Patient comes in with concern for chest pain.  On examination he is got point tenderness over his trapezius muscle.  Suspect most likely musculoskeletal.  Cardiac markers were ordered and negative x 2.  Doubt dissection given no widened mediastinum on chest x-ray reassuring vital signs and reproducible chest pain.  No shortness of breath to suggest PE.  Patient does have notably low hemoglobin with priors below.  He does not have any history of stents that would require a blood transfusion today however he does need close oncology follow-up.  To do a rectal exam and he is got no signs of black stool.  No stool ball noted.  His abdomen is soft and nontender doubt acute abdominal process.  Discussed with [facility] and his  hemoglobin was 7.7 on 12/12.  He is got no signs of active bleeding with no melena on examination and had a colonoscopy that I reviewed back in 2023 that was negative.  They report that he is already on iron supplementation.  I discussed the case with Dr. Orlie Dakin from oncology and agrees with holding off on transfusion today given he does not meet the threshold of less than 7, does not have any history of stents to make it less than 8.  He will follow him up in the office.  Discussed this with patient he felt comfortable with this plan.     FINAL CLINICAL IMPRESSION(S) / ED DIAGNOSES   Final diagnoses:  Chronic anemia  Atypical chest pain     Rx / DC Orders   ED Discharge Orders          Ordered    lidocaine (LIDODERM) 5 %  Every 12 hours        07/31/23 1056             Note:  This document was prepared using Dragon voice recognition software and may include unintentional dictation errors.   Concha Se, MD 07/31/23 1059

## 2023-07-31 NOTE — ED Notes (Signed)
Called for tramsport back to [facility]

## 2023-07-31 NOTE — ED Triage Notes (Signed)
Pt presents to ER via ems from [facility] with c/o left side chest pain and left shoulder pain that started appx 12 hours.  Pt states the pain has been getting worse since starting.  States nothing makes the pain better or worse.  Pt denies any hx of heart problems.  Pt is otherwise alert and in NAD at this time.

## 2023-07-31 NOTE — Discharge Instructions (Addendum)
You have evidence of low blood levels called anemia.  However you are not below the threshold of 7 to needed blood transfusion.  I did discuss the case with her oncologist Dr. Orlie Dakin who will call your facility tomorrow to help facilitate a follow-up appointment but if you do not hear from them you can call them.  If you develop any black stools, shortness of breath, passing out you can always return for recheck of your hemoglobin here but this seems like a chronic process that just needs to be followed up outpatient.  If the blood levels go below 7 then you would need to have a blood transfusion done.  Return to the ER if you develop any other concerns.  For your shoulder pain this seems reproducible so I suspect a muscle.  You can take Tylenol 1 g every 8 hours with the lidocaine patches.

## 2023-08-08 ENCOUNTER — Other Ambulatory Visit: Payer: Self-pay | Admitting: Oncology

## 2023-08-08 DIAGNOSIS — D649 Anemia, unspecified: Secondary | ICD-10-CM

## 2023-08-09 ENCOUNTER — Inpatient Hospital Stay: Payer: 59 | Admitting: Oncology

## 2023-08-09 ENCOUNTER — Inpatient Hospital Stay: Payer: 59 | Attending: Oncology

## 2023-08-09 ENCOUNTER — Other Ambulatory Visit: Payer: Self-pay | Admitting: *Deleted

## 2023-08-09 ENCOUNTER — Encounter: Payer: Self-pay | Admitting: Oncology

## 2023-08-09 VITALS — BP 120/97 | HR 85 | Temp 97.4°F | Resp 18 | Wt 233.0 lb

## 2023-08-09 DIAGNOSIS — G629 Polyneuropathy, unspecified: Secondary | ICD-10-CM | POA: Insufficient documentation

## 2023-08-09 DIAGNOSIS — D649 Anemia, unspecified: Secondary | ICD-10-CM | POA: Diagnosis not present

## 2023-08-09 DIAGNOSIS — M8580 Other specified disorders of bone density and structure, unspecified site: Secondary | ICD-10-CM | POA: Diagnosis not present

## 2023-08-09 LAB — RETICULOCYTES
Immature Retic Fract: 31.7 % — ABNORMAL HIGH (ref 2.3–15.9)
RBC.: 2.79 MIL/uL — ABNORMAL LOW (ref 4.22–5.81)
Retic Count, Absolute: 108 10*3/uL (ref 19.0–186.0)
Retic Ct Pct: 3.9 % — ABNORMAL HIGH (ref 0.4–3.1)

## 2023-08-09 LAB — CBC (CANCER CENTER ONLY)
HCT: 23.7 % — ABNORMAL LOW (ref 39.0–52.0)
Hemoglobin: 6.6 g/dL — CL (ref 13.0–17.0)
MCH: 23.8 pg — ABNORMAL LOW (ref 26.0–34.0)
MCHC: 27.8 g/dL — ABNORMAL LOW (ref 30.0–36.0)
MCV: 85.6 fL (ref 80.0–100.0)
Platelet Count: 300 10*3/uL (ref 150–400)
RBC: 2.77 MIL/uL — ABNORMAL LOW (ref 4.22–5.81)
RDW: 16 % — ABNORMAL HIGH (ref 11.5–15.5)
WBC Count: 4.4 10*3/uL (ref 4.0–10.5)
nRBC: 0 % (ref 0.0–0.2)

## 2023-08-09 LAB — LACTATE DEHYDROGENASE: LDH: 108 U/L (ref 98–192)

## 2023-08-09 LAB — IRON AND TIBC
Iron: 191 ug/dL — ABNORMAL HIGH (ref 45–182)
Saturation Ratios: 31 % (ref 17.9–39.5)
TIBC: 612 ug/dL — ABNORMAL HIGH (ref 250–450)
UIBC: 421 ug/dL

## 2023-08-09 LAB — SAMPLE TO BLOOD BANK

## 2023-08-09 LAB — VITAMIN B12: Vitamin B-12: 218 pg/mL (ref 180–914)

## 2023-08-09 LAB — FOLATE: Folate: 8.6 ng/mL (ref >5.9)

## 2023-08-09 LAB — PREPARE RBC (CROSSMATCH)

## 2023-08-09 LAB — FERRITIN: Ferritin: 7 ng/mL — ABNORMAL LOW (ref 24–336)

## 2023-08-09 LAB — ABO/RH: ABO/RH(D): O POS

## 2023-08-09 NOTE — Progress Notes (Signed)
 [facility]  Telephone:([phone] [phone]  ID: Velma Notice OB: [birth year]  MR#: [id number]  RDW#:[id number]  Patient Care Team: Pcp, No as PCP - General  CHIEF COMPLAINT: Anemia, unspecified.  INTERVAL HISTORY: Patient is a 68 year old male who was recently seen in the emergency room and found to have a hemoglobin of 7.7.  He is referred for further evaluation and consideration of blood transfusion.  Repeat blood work today revealed a hemoglobin continues to decline and is now 6.6.  Patient states he feels fatigued, but otherwise feels well he has no neurologic complaints.  He denies any recent fevers or illnesses.  He has a good appetite and denies weight loss.  He has no chest pain, shortness of breath, cough, or hemoptysis.  He denies any nausea, vomiting, constipation, or diarrhea.  There is no reported melena or hematochezia.  He has no urinary complaints.  Patient offers no further specific complaints today.  REVIEW OF SYSTEMS:   Review of Systems  Constitutional:  Positive for malaise/fatigue. Negative for fever and weight loss.  Respiratory: Negative.  Negative for cough, hemoptysis and shortness of breath.   Cardiovascular: Negative.  Negative for chest pain and leg swelling.  Gastrointestinal: Negative.  Negative for abdominal pain, blood in stool and melena.  Genitourinary: Negative.  Negative for hematuria.  Musculoskeletal: Negative.  Negative for back pain.  Skin: Negative.  Negative for rash.  Neurological:  Positive for weakness. Negative for dizziness, focal weakness and headaches.  Psychiatric/Behavioral: Negative.  The patient is not nervous/anxious.     As per HPI. Otherwise, a complete review of systems is negative.  PAST MEDICAL HISTORY: Past Medical History:  Diagnosis Date   Alcohol dependence (HCC)    Arthritis    Cirrhosis, alcoholic (HCC)    COPD (chronic obstructive pulmonary disease) (HCC)    Encephalopathy     Hypertension    Polyneuropathy     PAST SURGICAL HISTORY: Past Surgical History:  Procedure Laterality Date   COLONOSCOPY WITH PROPOFOL  N/A 10/09/2021   Procedure: COLONOSCOPY WITH PROPOFOL ;  Surgeon: Therisa Bi, MD;  Location: [facility] ENDOSCOPY;  Service: Gastroenterology;  Laterality: N/A;   JOINT REPLACEMENT      FAMILY HISTORY: History reviewed. No pertinent family history.  ADVANCED DIRECTIVES (Y/N):  N  HEALTH MAINTENANCE: Social History   Tobacco Use   Smoking status: Every Day    Types: Cigarettes  Vaping Use   Vaping status: Never Used  Substance Use Topics   Alcohol use: Not Currently   Drug use: Not Currently    Comment: last time used Cocaine 3 years ago     Colonoscopy:  PAP:  Bone density:  Lipid panel:  Allergies  Allergen Reactions   Lisinopril Other (See Comments) and Swelling   Ibuprofen Other (See Comments) and Swelling   Losartan Other (See Comments) and Swelling    Current Outpatient Medications  Medication Sig Dispense Refill   acetaminophen  (TYLENOL ) 325 MG tablet Take 975 mg by mouth 3 (three) times daily.     alum & mag hydroxide-simeth (MAALOX/MYLANTA) 200-200-20 MG/5ML suspension Take 30 mLs by mouth once.     amLODipine  (NORVASC ) 10 MG tablet Take 10 mg by mouth daily.     aspirin 81 MG chewable tablet Chew 81 mg by mouth daily.     bisacodyl  (DULCOLAX) 5 MG EC tablet Take 10 mg by mouth daily as needed for moderate constipation.     divalproex (DEPAKOTE) 125 MG DR tablet Take 250 mg by mouth  2 (two) times daily.     DULoxetine (CYMBALTA) 60 MG capsule Take 60 mg by mouth daily.     Ergocalciferol  (VITAMIN D2) 10 MCG (400 UNIT) TABS Take 1 tablet by mouth daily.     ferrous sulfate 325 (65 FE) MG tablet Take 325 mg by mouth every other day.     gabapentin  (NEURONTIN ) 800 MG tablet Take 800 mg by mouth 3 (three) times daily.     Ipratropium-Albuterol (COMBIVENT RESPIMAT) 20-100 MCG/ACT AERS respimat Inhale 1 puff into the lungs 4 (four)  times daily.     latanoprost (XALATAN) 0.005 % ophthalmic solution Place 1 drop into both eyes at bedtime.     Multiple Vitamin (MULTIVITAMIN WITH MINERALS) TABS tablet Take 1 tablet by mouth daily.     senna-docusate (SENOKOT-S) 8.6-50 MG tablet Take 2 tablets by mouth daily.     traMADol  (ULTRAM ) 50 MG tablet Take 25 mg by mouth every 12 (twelve) hours as needed for moderate pain (pain score 4-6).     diclofenac  Sodium (VOLTAREN ) 1 % GEL Apply 2 g topically 4 (four) times daily. (Patient not taking: Reported on 07/31/2023)     folic acid  (FOLVITE ) 1 MG tablet Take 1 mg by mouth daily. (Patient not taking: Reported on 07/31/2023)     thiamine  100 MG tablet Take 100 mg by mouth daily. (Patient not taking: Reported on 07/31/2023)     Tiotropium Bromide  Monohydrate 2.5 MCG/ACT AERS Inhale 1 puff into the lungs daily. (Patient not taking: Reported on 07/31/2023)     No current facility-administered medications for this visit.    OBJECTIVE: Vitals:   08/09/23 1008  BP: (!) 120/97  Pulse: 85  Resp: 18  Temp: (!) 97.4 F (36.3 C)  SpO2: 98%     Body mass index is 28.36 kg/m.    ECOG FS:1 - Symptomatic but completely ambulatory  General: Well-developed, well-nourished, no acute distress.  Sitting in a wheelchair. Eyes: Pink conjunctiva, anicteric sclera. HEENT: Normocephalic, moist mucous membranes. Lungs: No audible wheezing or coughing. Heart: Regular rate and rhythm. Abdomen: Soft, nontender, no obvious distention. Musculoskeletal: No edema, cyanosis, or clubbing. Neuro: Alert, answering all questions appropriately. Cranial nerves grossly intact. Skin: No rashes or petechiae noted. Psych: Normal affect. Lymphatics: No cervical, calvicular, axillary or inguinal LAD.   LAB RESULTS:  Lab Results  Component Value Date   NA 140 07/31/2023   K 3.5 07/31/2023   CL 104 07/31/2023   CO2 22 07/31/2023   GLUCOSE 99 07/31/2023   BUN 12 07/31/2023   CREATININE 0.75 07/31/2023    CALCIUM 9.0 07/31/2023   PROT 7.7 07/31/2023   ALBUMIN 3.9 07/31/2023   AST 27 07/31/2023   ALT 20 07/31/2023   ALKPHOS 66 07/31/2023   BILITOT 0.5 07/31/2023   GFRNONAA >60 07/31/2023    Lab Results  Component Value Date   WBC 4.4 08/09/2023   NEUTROABS 4.8 04/02/2021   HGB 6.6 (LL) 08/09/2023   HCT 23.7 (L) 08/09/2023   MCV 85.6 08/09/2023   PLT 300 08/09/2023   Lab Results  Component Value Date   IRON 191 (H) 08/09/2023   TIBC 612 (H) 08/09/2023   IRONPCTSAT 31 08/09/2023   Lab Results  Component Value Date   FERRITIN 7 (L) 08/09/2023     STUDIES: DG Chest 2 View Result Date: 07/31/2023 CLINICAL DATA:  Left chest pain, left shoulder pain. EXAM: CHEST - 2 VIEW COMPARISON:  Portable chest 03/31/2021 FINDINGS: The cardiac size is normal. There is aortic  atherosclerosis and tortuosity with stable mediastinum. The lungs are clear. There is mild chronic anterior wedging of the L1 vertebral body. No new osseous abnormality. Mild osteopenia. IMPRESSION: 1. No evidence of acute chest disease. Aortic atherosclerosis. Stable exam. 2. Mild osteopenia with chronic L1 compression fracture. Electronically Signed   By: Francis Quam M.D.   On: 07/31/2023 06:47    ASSESSMENT: Anemia, unspecified.  PLAN:    Anemia, unspecified: Patient's hemoglobin has declined to 6.6.  He also is noted to have a significantly reduced ferritin of 7.  Folate levels are within normal limits and there is no evidence of hemolysis.  The remainder of his laboratory work is pending at time of dictation.  Patient underwent colonoscopy and EGD on October 09, 2021 that did not reveal any significant pathology.  A referral has been sent back to GI for further evaluation.  Return to clinic tomorrow for 2 units of packed red blood cells.  Patient would then return to clinic in 2 weeks for repeat laboratory work, further evaluation, and additional transfusion if needed.  I spent a total of 45 minutes reviewing chart  data, face-to-face evaluation with the patient, counseling and coordination of care as detailed above.   Patient expressed understanding and was in agreement with this plan. He also understands that He can call clinic at any time with any questions, concerns, or complaints.    Evalene JINNY Reusing, MD   08/09/2023 12:53 PM

## 2023-08-09 NOTE — Progress Notes (Signed)
Patient here today for evaluation regarding anemia.

## 2023-08-10 ENCOUNTER — Inpatient Hospital Stay: Payer: 59

## 2023-08-10 DIAGNOSIS — D649 Anemia, unspecified: Secondary | ICD-10-CM | POA: Diagnosis not present

## 2023-08-10 LAB — ERYTHROPOIETIN: Erythropoietin: 271.4 m[IU]/mL — ABNORMAL HIGH (ref 2.6–18.5)

## 2023-08-10 LAB — HAPTOGLOBIN: Haptoglobin: 143 mg/dL (ref 32–363)

## 2023-08-10 MED ORDER — SODIUM CHLORIDE 0.9% IV SOLUTION
250.0000 mL | INTRAVENOUS | Status: DC
  Administered 2023-08-10: 250 mL via INTRAVENOUS
  Filled 2023-08-10: qty 250

## 2023-08-10 MED ORDER — DIPHENHYDRAMINE HCL 50 MG/ML IJ SOLN
25.0000 mg | Freq: Once | INTRAMUSCULAR | Status: AC
  Administered 2023-08-10: 25 mg via INTRAVENOUS
  Filled 2023-08-10: qty 1

## 2023-08-10 MED ORDER — ACETAMINOPHEN 325 MG PO TABS
650.0000 mg | ORAL_TABLET | Freq: Once | ORAL | Status: DC
  Filled 2023-08-10: qty 2

## 2023-08-10 NOTE — Patient Instructions (Signed)
 Blood Transfusion, Adult A blood transfusion is a procedure in which you receive blood or a type of blood cell (blood component) through an IV. You may need a blood transfusion when you have a low blood count, which is a low number of any blood cell. This may result from a bleeding disorder, illness, injury, or surgery. The blood may come from a donor, or you may be able to have your own blood collected and stored (autologous blood donation) before a planned surgery. The blood given in a transfusion may be made up of different blood components. You may receive: Red blood cells. These carry oxygen to the cells in the body. Platelets. These help your blood to clot. Plasma. This is the liquid part of your blood. It carries proteins and other substances throughout the body. White blood cells. These help you fight infections. If you have hemophilia or another clotting disorder, you may also receive other types of blood products. Depending on the type of blood product, this procedure may take 1-4 hours to complete. Tell a health care provider about: Any bleeding problems you have. Any previous reactions you have had during a blood transfusion. Any allergies you have. All medicines you are taking, including vitamins, herbs, eye drops, creams, and over-the-counter medicines. Any surgeries you have had. Any medical conditions you have. Whether you are pregnant or may be pregnant. What are the risks? Talk with your health care provider about risks. The most common problems include: A mild allergic reaction, such as red, swollen areas of skin (hives) and itching. Fever or chills. This may be the body's response to new blood cells received. This may occur during or up to 4 hours after the transfusion. More serious problems may include: A serious allergic reaction that causes difficulty breathing or swelling around the face and lips. Transfusion-associated circulatory overload (TACO), or too much fluid in  the lungs. This may cause breathing problems. Transfusion-related acute lung injury (TRALI), which causes breathing difficulty and low oxygen in the blood. This can occur within hours of the transfusion or several days later. Iron overload. This can happen after receiving many blood transfusions over a period of time. Infection or virus being transmitted. This is rare because donated blood is carefully tested before it is given. Hemolytic transfusion reaction. This is rare. It happens when the body's defense system (immune system)tries to attack the new blood cells. Symptoms may include fever, chills, nausea, low blood pressure, and low back or chest pain. Transfusion-associated graft-versus-host disease (TAGVHD). This is rare. It happens when donated cells attack the body's healthy tissues. What happens before the procedure? You will have a blood test to check your blood type. This test is done to know what kind of blood your body will accept and to match it to the donor blood. If you are going to have a planned surgery, you may be able to do an autologous blood donation. This may be done in case you need to have a transfusion. You will have your temperature, blood pressure, and pulse checked before the transfusion. If you have had an allergic reaction to a transfusion in the past, you may be given medicine to help prevent a reaction. This medicine may be given to you by mouth (orally) or through an IV. What happens during the procedure?  An IV will be inserted into one of your veins. The bag of blood will be attached to your IV. The blood will then enter through your vein. Your temperature, blood pressure,  and pulse will be monitored during the transfusion. This monitoring is done to detect early signs of a transfusion reaction. Tell your nurse right away if you have any of these symptoms during the transfusion: Shortness of breath or trouble breathing. Chest or back pain. Fever or  chills. Itching or hives. If you have any signs or symptoms of a reaction, your transfusion will be stopped and you may be given medicine. When the transfusion is complete, your IV will be removed. Pressure may be applied to the IV site for a few minutes. A bandage (dressing)will be applied. The procedure may vary among health care providers and hospitals. What happens after the procedure? Your temperature, blood pressure, pulse, breathing rate, and blood oxygen level will be monitored until you leave the hospital or clinic. Your blood may be tested to see how you have responded to the transfusion. You may be warmed with fluids or blankets to maintain a normal body temperature. If you receive your blood transfusion in an outpatient setting, you will be told whom to contact to report any reactions. Where to find more information Visit the [employer]: redcross.org Summary A blood transfusion is a procedure in which you receive blood or a type of blood cell (blood component) through an IV. The blood given in a transfusion may be made up of different blood components. You may receive red blood cells, platelets, plasma, or white blood cells depending on the condition treated. Your temperature, blood pressure, and pulse will be monitored before, during, and after the transfusion. After the transfusion, your blood may be tested to see how your body has responded. This information is not intended to replace advice given to you by your health care provider. Make sure you discuss any questions you have with your health care provider. Document Revised: 10/16/2021 Document Reviewed: 10/16/2021 Elsevier Patient Education  2024 [employer].

## 2023-08-11 LAB — TYPE AND SCREEN
ABO/RH(D): O POS
Antibody Screen: NEGATIVE
Unit division: 0
Unit division: 0

## 2023-08-11 LAB — BPAM RBC
Blood Product Expiration Date: 202502032359
Blood Product Unit Number: 202502032359
ISSUE DATE / TIME: 202501081127
PRODUCT CODE: 202501080929
PRODUCT CODE: 202502032359
Unit Type and Rh: 5100
Unit Type and Rh: 202502032359
Unit Type and Rh: 5100
Unit Type and Rh: 5100

## 2023-08-11 LAB — HGB FRACTIONATION CASCADE
Hgb A2: 1.6 % — ABNORMAL LOW (ref 1.8–3.2)
Hgb A: 98.4 % (ref 96.4–98.8)
Hgb F: 0 % (ref 0.0–2.0)
Hgb S: 0 %

## 2023-08-14 LAB — PROTEIN ELECTROPHORESIS, SERUM
A/G Ratio: 0.9 (ref 0.7–1.7)
Albumin ELP: 3.5 g/dL (ref 2.9–4.4)
Alpha-1-Globulin: 0.3 g/dL (ref 0.0–0.4)
Alpha-2-Globulin: 0.8 g/dL (ref 0.4–1.0)
Beta Globulin: 1.2 g/dL (ref 0.7–1.3)
Gamma Globulin: 1.4 g/dL (ref 0.4–1.8)
Globulin, Total: 3.7 g/dL (ref 2.2–3.9)
Total Protein ELP: 7.2 g/dL (ref 6.0–8.5)

## 2023-08-18 LAB — INTELLIGEN MYELOID

## 2023-08-19 ENCOUNTER — Other Ambulatory Visit: Payer: Self-pay | Admitting: *Deleted

## 2023-08-19 DIAGNOSIS — D649 Anemia, unspecified: Secondary | ICD-10-CM

## 2023-08-22 ENCOUNTER — Other Ambulatory Visit: Payer: Self-pay | Admitting: *Deleted

## 2023-08-22 ENCOUNTER — Encounter: Payer: Self-pay | Admitting: Oncology

## 2023-08-22 ENCOUNTER — Inpatient Hospital Stay (HOSPITAL_BASED_OUTPATIENT_CLINIC_OR_DEPARTMENT_OTHER): Payer: 59 | Admitting: Oncology

## 2023-08-22 ENCOUNTER — Inpatient Hospital Stay: Payer: 59

## 2023-08-22 VITALS — BP 138/90 | HR 88 | Temp 98.1°F | Resp 18 | Ht 76.0 in

## 2023-08-22 DIAGNOSIS — D649 Anemia, unspecified: Secondary | ICD-10-CM

## 2023-08-22 LAB — CBC WITH DIFFERENTIAL/PLATELET
Abs Immature Granulocytes: 0.01 10*3/uL (ref 0.00–0.07)
Basophils Absolute: 0 10*3/uL (ref 0.0–0.1)
Basophils Relative: 1 %
Eosinophils Absolute: 0.2 10*3/uL (ref 0.0–0.5)
Eosinophils Relative: 4 %
HCT: 29 % — ABNORMAL LOW (ref 39.0–52.0)
Hemoglobin: 8.6 g/dL — ABNORMAL LOW (ref 13.0–17.0)
Immature Granulocytes: 0 %
Lymphocytes Relative: 36 %
Lymphs Abs: 1.9 10*3/uL (ref 0.7–4.0)
MCH: 25.7 pg — ABNORMAL LOW (ref 26.0–34.0)
MCHC: 29.7 g/dL — ABNORMAL LOW (ref 30.0–36.0)
MCV: 86.6 fL (ref 80.0–100.0)
Monocytes Absolute: 0.7 10*3/uL (ref 0.1–1.0)
Monocytes Relative: 13 %
Neutro Abs: 2.4 10*3/uL (ref 1.7–7.7)
Neutrophils Relative %: 46 %
Platelets: 299 10*3/uL (ref 150–400)
RBC: 3.35 MIL/uL — ABNORMAL LOW (ref 4.22–5.81)
RDW: 15.8 % — ABNORMAL HIGH (ref 11.5–15.5)
WBC: 5.2 10*3/uL (ref 4.0–10.5)
nRBC: 0 % (ref 0.0–0.2)

## 2023-08-22 LAB — SAMPLE TO BLOOD BANK

## 2023-08-22 NOTE — Progress Notes (Signed)
[facility]  [phone] Fax:(336) [phone]  ID: Ryan Dunlap OB: [birth year]  MR#: [id number]  BOF#:[id number]  Patient Care Team: Pcp, No as PCP - General  CHIEF COMPLAINT: Anemia, unspecified.  INTERVAL HISTORY: Patient returns to clinic today for repeat laboratory work, further evaluation, and consideration of additional blood.  He feels significantly improved after receiving 2 units of packed red blood cells approximately 2 weeks ago.  His only complaint today is of a chronic peripheral neuropathy.  He has no other neurologic complaints.  He denies any recent fevers or illnesses.  He has a good appetite and denies weight loss.  He has no chest pain, shortness of breath, cough, or hemoptysis.  He denies any nausea, vomiting, constipation, or diarrhea.  There is no reported melena or hematochezia.  He has no urinary complaints.  Patient offers no further specific complaints today.  REVIEW OF SYSTEMS:   Review of Systems  Constitutional: Negative.  Negative for fever, malaise/fatigue and weight loss.  Respiratory: Negative.  Negative for cough, hemoptysis and shortness of breath.   Cardiovascular: Negative.  Negative for chest pain and leg swelling.  Gastrointestinal: Negative.  Negative for abdominal pain, blood in stool and melena.  Genitourinary: Negative.  Negative for hematuria.  Musculoskeletal: Negative.  Negative for back pain.  Skin: Negative.  Negative for rash.  Neurological:  Positive for tingling and sensory change. Negative for dizziness, focal weakness, weakness and headaches.  Psychiatric/Behavioral: Negative.  The patient is not nervous/anxious.     As per HPI. Otherwise, a complete review of systems is negative.  PAST MEDICAL HISTORY: Past Medical History:  Diagnosis Date   Alcohol dependence (HCC)    Arthritis    Cirrhosis, alcoholic (HCC)    COPD (chronic obstructive pulmonary disease) (HCC)    Encephalopathy    Hypertension     Polyneuropathy     PAST SURGICAL HISTORY: Past Surgical History:  Procedure Laterality Date   COLONOSCOPY WITH PROPOFOL N/A 10/09/2021   Procedure: COLONOSCOPY WITH PROPOFOL;  Surgeon: Wyline Mood, MD;  Location: [facility] ENDOSCOPY;  Service: Gastroenterology;  Laterality: N/A;   JOINT REPLACEMENT      FAMILY HISTORY: History reviewed. No pertinent family history.  ADVANCED DIRECTIVES (Y/N):  N  HEALTH MAINTENANCE: Social History   Tobacco Use   Smoking status: Every Day    Types: Cigarettes  Vaping Use   Vaping status: Never Used  Substance Use Topics   Alcohol use: Not Currently   Drug use: Not Currently    Comment: last time used Cocaine 3 years ago     Colonoscopy:  PAP:  Bone density:  Lipid panel:  Allergies  Allergen Reactions   Lisinopril Other (See Comments) and Swelling   Ibuprofen Other (See Comments) and Swelling   Losartan Other (See Comments) and Swelling    Current Outpatient Medications  Medication Sig Dispense Refill   acetaminophen (TYLENOL) 325 MG tablet Take 975 mg by mouth 3 (three) times daily.     amLODipine (NORVASC) 10 MG tablet Take 10 mg by mouth daily.     aspirin 81 MG chewable tablet Chew 81 mg by mouth daily.     divalproex (DEPAKOTE) 125 MG DR tablet Take 250 mg by mouth 2 (two) times daily.     DULoxetine (CYMBALTA) 60 MG capsule Take 60 mg by mouth daily.     ferrous sulfate 325 (65 FE) MG tablet Take 325 mg by mouth every other day.     folic acid (FOLVITE) 1 MG  tablet Take 1 mg by mouth daily.     gabapentin (NEURONTIN) 800 MG tablet Take 800 mg by mouth 3 (three) times daily.     Ipratropium-Albuterol (COMBIVENT RESPIMAT) 20-100 MCG/ACT AERS respimat Inhale 1 puff into the lungs 4 (four) times daily.     latanoprost (XALATAN) 0.005 % ophthalmic solution Place 1 drop into both eyes at bedtime.     Multiple Vitamin (MULTIVITAMIN WITH MINERALS) TABS tablet Take 1 tablet by mouth daily.     traMADol (ULTRAM) 50 MG tablet Take 25 mg by  mouth every 12 (twelve) hours as needed for moderate pain (pain score 4-6).     alum & mag hydroxide-simeth (MAALOX/MYLANTA) 200-200-20 MG/5ML suspension Take 30 mLs by mouth once. (Patient not taking: Reported on 08/22/2023)     bisacodyl (DULCOLAX) 5 MG EC tablet Take 10 mg by mouth daily as needed for moderate constipation. (Patient not taking: Reported on 08/22/2023)     diclofenac Sodium (VOLTAREN) 1 % GEL Apply 2 g topically 4 (four) times daily. (Patient not taking: Reported on 07/31/2023)     Ergocalciferol (VITAMIN D2) 10 MCG (400 UNIT) TABS Take 1 tablet by mouth daily.     senna-docusate (SENOKOT-S) 8.6-50 MG tablet Take 2 tablets by mouth daily. (Patient not taking: Reported on 08/22/2023)     thiamine 100 MG tablet Take 100 mg by mouth daily. (Patient not taking: Reported on 07/31/2023)     Tiotropium Bromide Monohydrate 2.5 MCG/ACT AERS Inhale 1 puff into the lungs daily. (Patient not taking: Reported on 07/31/2023)     No current facility-administered medications for this visit.    OBJECTIVE: Vitals:   08/22/23 0934  BP: (!) 138/90  Pulse: 88  Resp: 18  Temp: 98.1 F (36.7 C)  SpO2: 100%     Body mass index is 28.36 kg/m.    ECOG FS:1 - Symptomatic but completely ambulatory  General: Well-developed, well-nourished, no acute distress.  Sitting in a wheelchair. Eyes: Pink conjunctiva, anicteric sclera. HEENT: Normocephalic, moist mucous membranes. Lungs: No audible wheezing or coughing. Heart: Regular rate and rhythm. Abdomen: Soft, nontender, no obvious distention. Musculoskeletal: No edema, cyanosis, or clubbing. Neuro: Alert, answering all questions appropriately. Cranial nerves grossly intact. Skin: No rashes or petechiae noted. Psych: Normal affect.   LAB RESULTS:  Lab Results  Component Value Date   NA 140 07/31/2023   K 3.5 07/31/2023   CL 104 07/31/2023   CO2 22 07/31/2023   GLUCOSE 99 07/31/2023   BUN 12 07/31/2023   CREATININE 0.75 07/31/2023    CALCIUM 9.0 07/31/2023   PROT 7.7 07/31/2023   ALBUMIN 3.9 07/31/2023   AST 27 07/31/2023   ALT 20 07/31/2023   ALKPHOS 66 07/31/2023   BILITOT 0.5 07/31/2023   GFRNONAA >60 07/31/2023    Lab Results  Component Value Date   WBC 5.2 08/22/2023   NEUTROABS 2.4 08/22/2023   HGB 8.6 (L) 08/22/2023   HCT 29.0 (L) 08/22/2023   MCV 86.6 08/22/2023   PLT 299 08/22/2023   Lab Results  Component Value Date   IRON 191 (H) 08/09/2023   TIBC 612 (H) 08/09/2023   IRONPCTSAT 31 08/09/2023   Lab Results  Component Value Date   FERRITIN 7 (L) 08/09/2023     STUDIES: DG Chest 2 View Result Date: 07/31/2023 CLINICAL DATA:  Left chest pain, left shoulder pain. EXAM: CHEST - 2 VIEW COMPARISON:  Portable chest 03/31/2021 FINDINGS: The cardiac size is normal. There is aortic atherosclerosis and tortuosity with stable  mediastinum. The lungs are clear. There is mild chronic anterior wedging of the L1 vertebral body. No new osseous abnormality. Mild osteopenia. IMPRESSION: 1. No evidence of acute chest disease. Aortic atherosclerosis. Stable exam. 2. Mild osteopenia with chronic L1 compression fracture. Electronically Signed   By: Almira Bar M.D.   On: 07/31/2023 06:47    ASSESSMENT: Anemia, unspecified.  PLAN:    Anemia, unspecified: Patient's hemoglobin has improved to 8.6 with 2 units of packed red blood cells.  All of his other laboratory work other than a mildly reduced ferritin of 7 is either negative or within normal limits.  Patient underwent colonoscopy and EGD on October 09, 2021 that did not reveal any significant pathology.  He has follow-up with GI on September 12, 2023.  Patient does not require transfusion tomorrow.  Return to clinic in 2 weeks for laboratory work and transfusion if needed.  He would then return to clinic in 4 weeks for further evaluation.    I spent a total of 20 minutes reviewing chart data, face-to-face evaluation with the patient, counseling and coordination of  care as detailed above.   Patient expressed understanding and was in agreement with this plan. He also understands that He can call clinic at any time with any questions, concerns, or complaints.    Jeralyn Ruths, MD   08/22/2023 10:01 AM

## 2023-08-23 ENCOUNTER — Inpatient Hospital Stay: Payer: 59

## 2023-09-05 ENCOUNTER — Inpatient Hospital Stay: Payer: 59

## 2023-09-06 ENCOUNTER — Inpatient Hospital Stay: Payer: 59

## 2023-09-15 DIAGNOSIS — F17200 Nicotine dependence, unspecified, uncomplicated: Secondary | ICD-10-CM

## 2023-09-15 DIAGNOSIS — I10 Essential (primary) hypertension: Secondary | ICD-10-CM | POA: Insufficient documentation

## 2023-09-15 HISTORY — DX: Nicotine dependence, unspecified, uncomplicated: F17.200

## 2023-09-15 HISTORY — DX: Essential (primary) hypertension: I10

## 2023-09-19 ENCOUNTER — Other Ambulatory Visit: Payer: 59

## 2023-09-19 ENCOUNTER — Ambulatory Visit: Payer: 59 | Admitting: Oncology

## 2023-09-19 NOTE — Progress Notes (Deleted)
 Celso Amy, PA-C [address]  [address]  [place], [place] [address]  Main: [phone]  Fax: [phone]   Gastroenterology Consultation  Referring Provider:     No ref. provider found Primary Care Physician:  Pcp, No Primary Gastroenterologist:  Celso Amy, PA-C / Dr. Wyline Mood   Reason for Consultation:     Anemia        HPI:   Ryan Dunlap is a 68 y.o. y/o male referred for consultation & management  by Pcp, No.  Patient has chronic anemia for several years.  Received 2 units blood transfusion 08/11/2023.  Recently saw hematologist Dr. Orlie Dakin 08/22/2023.  He has chronic peripheral neuropathy.  Denies any GI symptoms such as abdominal pain, weight loss, nausea, vomiting, diarrhea, constipation, melena, or hematochezia.  Ref Range & Units (hover) 4 wk ago (08/22/23) 1 mo ago (08/09/23) 1 mo ago (07/31/23) 1 yr ago (06/07/22) 2 yr ago (04/02/21) 2 yr ago (04/01/21) 2 yr ago (03/31/21)  WBC 5.2 4.4 6.0 8.2 9.4 11.5 High  11.3 High   RBC 3.35 Low  2.77 Low  3.08 Low  3.13 Low  3.79 Low  3.68 Low  3.84 Low   Hemoglobin 8.6 Low  6.6 Low Panic  CM 7.3 Low  8.2 Low  9.3 Low  8.9 Low  CM 9.7 Low   HCT 29.0 Low  23.7 Low  25.6 Low  26.7 Low  29.0 Low  28.4 Low  29.4 Low   MCV 86.6 85.6 83.1 85.3 76.5 Low  77.2 Low  76.6 Low   MCH 25.7 Low  23.8 Low  23.7 Low  26.2 24.5 Low  24.2 Low  25.3 Low   MCHC 29.7 Low  27.8 Low  28.5 Low  30.7 32.1 31.3 33.0  RDW 15.8 High  16.0 High  15.5 15.7 High  18.9 High  19.3 High  19.1 High   Platelets 299 300 340 273 178 168 209   08/09/2023: Low normal vitamin B12 of 218, normal folate 8.6, elevated total iron 191, iron saturation 31%, low ferritin 7.  09/2021 screening colonoscopy by Dr. Tobi Bastos: Normal.  10-year repeat.  No previous EGD.  History of alcohol dependence, chronic hepatitis C, tobacco dependence current smoker, COPD, alcoholic cirrhosis, Warnicke's encephalopathy, polyneuropathy, history of cocaine abuse, marijuana use, opioid  dependence.  Currently takes folic acid 1 Mg daily, ferrous sulfate 325 mg 1 tablet every other day.  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   Chronic obstructive pulmonary disease (HCC) 05/19/2016   Chronic pain of right knee 01/06/2016   Cirrhosis, alcoholic (HCC)    Closed compression fracture of first lumbar vertebra (HCC) 09/01/2022   Cocaine abuse (HCC) 03/21/2019   COPD (chronic obstructive pulmonary disease) (HCC)    Delirium 09/10/2020   Difficulty walking 11/25/2021   Dyslipidemia 09/07/2016   Dysphagia 09/10/2020   E-coli UTI 09/11/2020   Encephalopathy    Essential hypertension 09/15/2023   Heavy alcohol use 09/11/2013   Patient said "I am alcoholic"     History of cocaine abuse (HCC) 09/07/2016   Hyperlipidemia 05/31/2019   Hypertension    Hypochloremia 03/28/2017   Hypogonadism in male  09/07/2016   Hypokalemia 09/09/2016   Hyponatremia 09/07/2016   Iron deficiency anemia 09/07/2016   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   Lumbar radiculopathy 09/01/2022   Lumbar spondylosis 12/28/2022   Marijuana use 05/31/2019   Neuropathy 12/28/2022   Numbness and tingling 11/25/2021   Opioid type dependence, continuous (HCC) 12/28/2022   Osteoarthritis of left hip 09/20/2013   Other osteoporosis without current pathological fracture 04/09/2017   Pain in left foot 07/08/2021   Polyneuropathy    Restless legs 11/25/2021   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 Authorizing Provider  acetaminophen (TYLENOL) 325 MG tablet Take 975 mg by mouth 3 (three) times daily.    [provider]  alum & mag hydroxide-simeth (MAALOX/MYLANTA) 200-200-20 MG/5ML suspension Take 30 mLs by mouth once. Patient not taking: Reported on 08/22/2023    [provider]  amLODipine (NORVASC) 10 MG tablet Take 10 mg by mouth daily.    [provider]  aspirin 81 MG chewable tablet Chew 81 mg by mouth daily.    [provider]  bisacodyl (DULCOLAX) 5 MG EC tablet Take 10 mg by mouth daily as needed for moderate constipation. Patient not taking: Reported on 08/22/2023    [provider]  diclofenac Sodium (VOLTAREN) 1 % GEL Apply 2 g topically 4 (four) times daily. Patient not taking: Reported on 07/31/2023    [provider]  divalproex (DEPAKOTE) 125 MG DR tablet Take 250 mg by mouth 2 (two) times daily.    [provider]  DULoxetine (CYMBALTA) 60 MG capsule Take 60 mg by mouth daily.    [provider]  Ergocalciferol (VITAMIN D2) 10 MCG (400 UNIT) TABS Take 1 tablet by mouth daily.    [provider]  ferrous sulfate 325 (65 FE) MG tablet Take 325 mg by mouth every other day.    [provider]  folic acid (FOLVITE) 1 MG tablet Take 1 mg by mouth daily.    [provider]  gabapentin (NEURONTIN) 800 MG tablet Take 800 mg by mouth 3 (three) times daily.    [provider]  Ipratropium-Albuterol (COMBIVENT RESPIMAT) 20-100 MCG/ACT AERS respimat Inhale 1 puff into the lungs 4 (four) times daily.    [provider]  latanoprost (XALATAN) 0.005 % ophthalmic solution Place 1  drop into both eyes at bedtime.    [provider]  Multiple Vitamin (MULTIVITAMIN WITH MINERALS) TABS tablet Take 1 tablet by mouth daily.    [provider]  senna-docusate (SENOKOT-S) 8.6-50 MG tablet Take 2 tablets by mouth daily. Patient not taking: Reported on 08/22/2023    [provider]  thiamine 100 MG tablet Take 100 mg by mouth daily. Patient not taking: Reported on 07/31/2023    [provider]  Tiotropium Bromide Monohydrate 2.5 MCG/ACT AERS Inhale 1 puff into the lungs daily. Patient not taking: Reported on 07/31/2023    [provider]  traMADol (ULTRAM) 50 MG tablet Take 25 mg by mouth  every 12 (twelve) hours as needed for moderate pain (pain score 4-6).    [provider]    No family history on file.   Social History   Tobacco Use   Smoking status: Every Day    Types: Cigarettes  Vaping Use   Vaping status: Never Used  Substance Use Topics   Alcohol use: Not Currently   Drug use: Not Currently    Comment: last time used Cocaine 3 years ago    Allergies as of 09/20/2023 - Review Complete 08/22/2023  Allergen Reaction Noted   Lisinopril Other (See Comments) and Swelling 10/13/2012   Ibuprofen Other (See Comments) and Swelling 01/02/2016   Losartan Other (See Comments) and Swelling 10/13/2012    Review of Systems:    All systems reviewed and negative except where noted in HPI.   Physical Exam:  There were no vitals taken for this visit. No LMP for male patient.  General:   Alert,  Well-developed, well-nourished, pleasant and cooperative in NAD Lungs:  Respirations even and unlabored.  Clear throughout to auscultation.   No wheezes, crackles, or rhonchi. No acute distress. Heart:  Regular rate and rhythm; no murmurs, clicks, rubs, or gallops. Abdomen:  Normal bowel sounds.  No bruits.  Soft, and non-distended without masses, hepatosplenomegaly or hernias noted.  No Tenderness.  No guarding or rebound  tenderness.    Neurologic:  Alert and oriented x3;  grossly normal neurologically. Psych:  Alert and cooperative. Normal mood and affect.  Imaging Studies: No results found.  Assessment and Plan:   Ryan Dunlap is a 68 y.o. y/o male has been referred for   1.  Anemia: Multifactorial: Iron deficiency, B12 deficiency, folate deficiency.  I am concerned about upper GI bleed.  Labs: CBC, iron panel, ferritin, B12  Scheduling EGD I discussed risks of EGD with patient to include risk of bleeding, perforation, and risk of sedation.  Patient expressed understanding and agrees to proceed with EGD.   2.  Alcoholic cirrhosis  Lab: CMP, PT/INR.  Abdominal liver ultrasound or CT  Stop alcohol  Follow up ***  Celso Amy, PA-C    BP check ***

## 2023-09-20 ENCOUNTER — Ambulatory Visit: Payer: 59

## 2023-09-20 ENCOUNTER — Ambulatory Visit: Payer: 59 | Admitting: Physician Assistant
